# Patient Record
Sex: Male | Born: 1985 | Race: White | Hispanic: No | Marital: Married | Smoking: Never smoker
Health system: Southern US, Community
[De-identification: ages and names within clinical notes are randomized; demographics above are authoritative.]

## PROBLEM LIST (undated history)

## (undated) DIAGNOSIS — Z87442 Personal history of urinary calculi: Secondary | ICD-10-CM

## (undated) DIAGNOSIS — K219 Gastro-esophageal reflux disease without esophagitis: Secondary | ICD-10-CM

## (undated) DIAGNOSIS — F909 Attention-deficit hyperactivity disorder, unspecified type: Secondary | ICD-10-CM

## (undated) HISTORY — PX: VASECTOMY: SHX75

## (undated) HISTORY — PX: SHOULDER SURGERY: SHX246

---

## 2021-06-20 ENCOUNTER — Emergency Department (HOSPITAL_COMMUNITY)
Admission: EM | Admit: 2021-06-20 | Discharge: 2021-06-20 | Disposition: A | Discharge status: Home / Self Care | Payer: BC Managed Care – PPO | Attending: Emergency Medicine | Admitting: Emergency Medicine

## 2021-06-20 ENCOUNTER — Emergency Department (HOSPITAL_COMMUNITY): Payer: BC Managed Care – PPO

## 2021-06-20 ENCOUNTER — Other Ambulatory Visit: Payer: Self-pay

## 2021-06-20 ENCOUNTER — Encounter (HOSPITAL_COMMUNITY): Payer: Self-pay

## 2021-06-20 DIAGNOSIS — Z23 Encounter for immunization: Secondary | ICD-10-CM | POA: Insufficient documentation

## 2021-06-20 DIAGNOSIS — S60419A Abrasion of unspecified finger, initial encounter: Secondary | ICD-10-CM

## 2021-06-20 DIAGNOSIS — S6991XA Unspecified injury of right wrist, hand and finger(s), initial encounter: Secondary | ICD-10-CM | POA: Diagnosis present

## 2021-06-20 DIAGNOSIS — W230XXA Caught, crushed, jammed, or pinched between moving objects, initial encounter: Secondary | ICD-10-CM | POA: Insufficient documentation

## 2021-06-20 DIAGNOSIS — S61304A Unspecified open wound of right ring finger with damage to nail, initial encounter: Secondary | ICD-10-CM | POA: Diagnosis not present

## 2021-06-20 DIAGNOSIS — S61309A Unspecified open wound of unspecified finger with damage to nail, initial encounter: Secondary | ICD-10-CM

## 2021-06-20 MED ORDER — OXYCODONE-ACETAMINOPHEN 5-325 MG PO TABS
1.0000 | ORAL_TABLET | Freq: Once | ORAL | Status: AC
  Administered 2021-06-20: 1 via ORAL
  Filled 2021-06-20: qty 1

## 2021-06-20 MED ORDER — BUPIVACAINE HCL (PF) 0.5 % IJ SOLN
10.0000 mL | Freq: Once | INTRAMUSCULAR | Status: AC
  Administered 2021-06-20: 10 mL
  Filled 2021-06-20: qty 30

## 2021-06-20 MED ORDER — TETANUS-DIPHTH-ACELL PERTUSSIS 5-2.5-18.5 LF-MCG/0.5 IM SUSY
0.5000 mL | PREFILLED_SYRINGE | Freq: Once | INTRAMUSCULAR | Status: AC
  Administered 2021-06-20: 0.5 mL via INTRAMUSCULAR
  Filled 2021-06-20: qty 0.5

## 2021-06-20 NOTE — ED Provider Notes (Signed)
[facility]-EMERGENCY DEPT Provider Note   CSN: [id number] Arrival date & time: 06/20/21  1356     History Chief Complaint  Patient presents with   Finger Injury    Mike West is a 35 y.o. male.  HPI He injured his right fourth finger when he accidentally shot in the trunk of his car.  He could not release it so he "pulled it out."  He denies other injuries.  Unknown tetanus status.  There are no other known active modifying factors.    History reviewed. No pertinent past medical history.  There are no problems to display for this patient.   History reviewed. No pertinent surgical history.     History reviewed. No pertinent family history.  Social History   Tobacco Use   Smoking status: Never   Smokeless tobacco: Never  Vaping Use   Vaping Use: Never used  Substance Use Topics   Alcohol use: Never   Drug use: Never    Home Medications Prior to Admission medications   Not on File    Allergies    Patient has no known allergies.  Review of Systems   Review of Systems  All other systems reviewed and are negative.  Physical Exam Updated Vital Signs BP 125/87 (BP Location: Left Arm)   Pulse 89   Temp 98.6 F (37 C) (Oral)   Resp 18   Ht 5\' 6"  (1.676 m)   Wt 70.3 kg   SpO2 97%   BMI 25.02 kg/m   Physical Exam Vitals and nursing note reviewed.  Constitutional:      General: He is in acute distress (Uncomfortable due to pain).     Appearance: He is well-developed. He is not ill-appearing.  HENT:     Head: Normocephalic and atraumatic.     Right Ear: External ear normal.     Left Ear: External ear normal.  Eyes:     Conjunctiva/sclera: Conjunctivae normal.     Pupils: Pupils are equal, round, and reactive to light.  Neck:     Trachea: Phonation normal.  Cardiovascular:     Rate and Rhythm: Normal rate.  Pulmonary:     Effort: Pulmonary effort is normal.  Abdominal:     General: There is no distension.   Musculoskeletal:     Cervical back: Normal range of motion and neck supple.     Comments: Right finger fourth finger type injury.  He guards against flexing the right finger for secondary to pain.  There is nail and pad injury of the right finger 4.  Skin:    General: Skin is warm and dry.  Neurological:     Mental Status: He is alert and oriented to person, place, and time.     Cranial Nerves: No cranial nerve deficit.     Sensory: No sensory deficit.     Motor: No abnormal muscle tone.     Coordination: Coordination normal.  Psychiatric:        Mood and Affect: Mood normal.        Behavior: Behavior normal.        Thought Content: Thought content normal.        Judgment: Judgment normal.    ED Results / Procedures / Treatments   Labs (all labs ordered are listed, but only abnormal results are displayed) Labs Reviewed - No data to display  EKG None  Radiology DG Finger Ring Right  Result Date: 06/20/2021 CLINICAL DATA:  Fourth finger got caught  in a car trunk. Pain to the distal finger. EXAM: RIGHT RING FINGER 2+V COMPARISON:  None. FINDINGS: Apparent injury to the nail.  No fracture.  No bone lesion. Joints are normally spaced and aligned. Distal soft tissue swelling. IMPRESSION: No fracture or dislocation. Electronically Signed   By: Amie Portland M.D.   On: 06/20/2021 15:22    Procedures Procedures   Wound repair  Date/Time: 06/20/2021 4:14 PM Performed by: Mancel Bale, MD Authorized by: Mancel Bale, MD  Patient identity confirmed: verbally with patient Local anesthesia used: yes Anesthesia: digital block  Anesthesia: Local anesthesia used: yes Local Anesthetic: bupivacaine 0.5% without epinephrine Anesthetic total: 5 mL  Sedation: Patient sedated: no  Patient tolerance: patient tolerated the procedure well with no immediate complications Comments: Right fourth fingertip cleansed with Betadine and irrigated with saline.  Injuries revealed to be as  follows: Pad with abrasion, 1 cm, split skin thickness.  Partially adherent flap of skin removed with scissors.  Partial nailbed avulsion, longitudinally, ulnar aspect, with blood extruding from the tip indicating open nailbed injury.  No gross deformity of the nailbed.  No associated laceration of the cuticle or finger structure.  Nail is not elevated off the nail bed.  Bandaged placed on wound by nursing.   Medications Ordered in ED Medications  oxyCODONE-acetaminophen (PERCOCET/ROXICET) 5-325 MG per tablet 1 tablet (1 tablet Oral Given 06/20/21 1515)  Tdap (BOOSTRIX) injection 0.5 mL (0.5 mLs Intramuscular Given 06/20/21 1516)  bupivacaine (MARCAINE) 0.5 % injection 10 mL (10 mLs Infiltration Given by Other 06/20/21 1515)    ED Course  I have reviewed the triage vital signs and the nursing notes.  Pertinent labs & imaging results that were available during my care of the patient were reviewed by me and considered in my medical decision making (see chart for details).  Clinical Course as of 06/20/21 1625  Sat Jun 20, 2021  1536 DG Finger Ring Right [SD]  1536 DG Finger Ring Right [SD]    Clinical Course User Index [SD] Beryle Lathe   MDM Rules/Calculators/A&P                            Patient Vitals for the past 24 hrs:  BP Temp Temp src Pulse Resp SpO2 Height Weight  06/20/21 1447 -- -- -- -- -- -- 5\' 6"  (1.676 m) 70.3 kg  06/20/21 1446 125/87 98.6 F (37 C) Oral 89 18 97 % -- --    4:16 PM Reevaluation with update and discussion. After initial assessment and treatment, an updated evaluation reveals he is comfortable after room.  Heart has no further complaints. 06/22/21   Medical Decision Making:  This patient is presenting for evaluation of fingertip injury, which does require a range of treatment options, and is a complaint that involves a moderate risk of morbidity and mortality. The differential diagnoses include fracture, laceration, abrasion,  contusion. I decided to review old records, and in summary isolated injury fingertip when he excellently shot it a car trunk lid.  I did not require additional historical information from anyone.  Radiologic Tests Ordered, included right finger for.  I independently Visualized: Radiographic images, which show no fracture   Critical Interventions-clinical evaluation, wound repair, tetanus booster  After These Interventions, the Patient was reevaluated and was found stable for discharge.  Wound did not require suturing, manipulation of the nail or nailbed.  Plan symptomatic wound care at home.  Recommended twice  daily soaks until healed    CRITICAL CARE-no Performed by: Mancel Bale  Nursing Notes Reviewed/ Care Coordinated Applicable Imaging Reviewed Interpretation of Laboratory Data incorporated into ED treatment  The patient appears reasonably screened and/or stabilized for discharge and I doubt any other medical condition or other [facility] requiring further screening, evaluation, or treatment in the ED at this time prior to discharge.  Plan: Home Medications-OTC as needed; Home Treatments-wound care; return here if the recommended treatment, does not improve the symptoms; Recommended follow up-PCP, PRN     Final Clinical Impression(s) / ED Diagnoses Final diagnoses:  Avulsion of fingernail, initial encounter  Abrasion of finger, initial encounter    Rx / DC Orders ED Discharge Orders     None        Mancel Bale, MD 06/20/21 1628

## 2021-06-20 NOTE — ED Triage Notes (Signed)
Closed right fourth digit finger in car trunk. Finger became stuck and pt had to pull finger out of trunk. No bleeding at this time.

## 2021-06-20 NOTE — Discharge Instructions (Signed)
There is no fracture or laceration of the finger.  Your nail is lifted up and may or may not fall off completely.  The abrasion on your finger will heal in a week or so.  To help the wounds heal and to be more comfortable, soak the right finger in warm water for 20 minutes, twice a day and clean it well with soap and water.  After that keep a bandage on it until the drainage and bleeding stops.  If the nail falls off it would take 1-2 months for the new one to grow in.  Take Tylenol or Motrin for pain.  Return here or see your doctor for problems.

## 2021-06-20 NOTE — ED Notes (Signed)
Tefla non adherent dressing applied to Patient finger and taped.

## 2021-08-18 ENCOUNTER — Emergency Department (HOSPITAL_COMMUNITY): Payer: BC Managed Care – PPO

## 2021-08-18 ENCOUNTER — Encounter (HOSPITAL_COMMUNITY): Payer: Self-pay

## 2021-08-18 ENCOUNTER — Emergency Department (HOSPITAL_COMMUNITY)
Admission: EM | Admit: 2021-08-18 | Discharge: 2021-08-19 | Disposition: A | Discharge status: Home / Self Care | Payer: BC Managed Care – PPO | Attending: Student | Admitting: Student

## 2021-08-18 ENCOUNTER — Other Ambulatory Visit: Payer: Self-pay

## 2021-08-18 DIAGNOSIS — T148XXA Other injury of unspecified body region, initial encounter: Secondary | ICD-10-CM

## 2021-08-18 DIAGNOSIS — S52612A Displaced fracture of left ulna styloid process, initial encounter for closed fracture: Secondary | ICD-10-CM | POA: Diagnosis not present

## 2021-08-18 DIAGNOSIS — S6992XA Unspecified injury of left wrist, hand and finger(s), initial encounter: Secondary | ICD-10-CM | POA: Diagnosis present

## 2021-08-18 DIAGNOSIS — Y9351 Activity, roller skating (inline) and skateboarding: Secondary | ICD-10-CM | POA: Insufficient documentation

## 2021-08-18 DIAGNOSIS — S52572A Other intraarticular fracture of lower end of left radius, initial encounter for closed fracture: Secondary | ICD-10-CM | POA: Diagnosis not present

## 2021-08-18 DIAGNOSIS — S52502A Unspecified fracture of the lower end of left radius, initial encounter for closed fracture: Secondary | ICD-10-CM

## 2021-08-18 DIAGNOSIS — Y9289 Other specified places as the place of occurrence of the external cause: Secondary | ICD-10-CM | POA: Diagnosis not present

## 2021-08-18 MED ORDER — HYDROCODONE-ACETAMINOPHEN 5-325 MG PO TABS
1.0000 | ORAL_TABLET | Freq: Once | ORAL | Status: AC
  Administered 2021-08-18: 18:00:00 1 via ORAL
  Filled 2021-08-18: qty 1

## 2021-08-18 MED ORDER — ONDANSETRON HCL 4 MG/2ML IJ SOLN
4.0000 mg | Freq: Once | INTRAMUSCULAR | Status: AC
  Administered 2021-08-18: 22:00:00 4 mg via INTRAVENOUS
  Filled 2021-08-18: qty 2

## 2021-08-18 MED ORDER — LIDOCAINE HCL (PF) 1 % IJ SOLN
10.0000 mL | Freq: Once | INTRAMUSCULAR | Status: AC
  Administered 2021-08-18: 21:00:00 10 mL
  Filled 2021-08-18: qty 30

## 2021-08-18 MED ORDER — FENTANYL CITRATE PF 50 MCG/ML IJ SOSY
100.0000 ug | PREFILLED_SYRINGE | Freq: Once | INTRAMUSCULAR | Status: AC
  Administered 2021-08-18: 23:00:00 100 ug via INTRAVENOUS
  Filled 2021-08-18: qty 2

## 2021-08-18 MED ORDER — MORPHINE SULFATE (PF) 4 MG/ML IV SOLN
4.0000 mg | Freq: Once | INTRAVENOUS | Status: AC
  Administered 2021-08-18: 22:00:00 4 mg via INTRAVENOUS
  Filled 2021-08-18: qty 1

## 2021-08-18 MED ORDER — FENTANYL CITRATE PF 50 MCG/ML IJ SOSY
100.0000 ug | PREFILLED_SYRINGE | Freq: Once | INTRAMUSCULAR | Status: AC
  Administered 2021-08-18: 21:00:00 100 ug via INTRAVENOUS
  Filled 2021-08-18: qty 2

## 2021-08-18 MED ORDER — KETOROLAC TROMETHAMINE 15 MG/ML IJ SOLN
15.0000 mg | Freq: Once | INTRAMUSCULAR | Status: AC
Start: 2021-08-18 — End: 2021-08-18
  Administered 2021-08-18: 23:00:00 15 mg via INTRAVENOUS
  Filled 2021-08-18: qty 1

## 2021-08-18 MED ORDER — ONDANSETRON HCL 4 MG/2ML IJ SOLN
4.0000 mg | Freq: Once | INTRAMUSCULAR | Status: AC
  Administered 2021-08-18: 23:00:00 4 mg via INTRAVENOUS
  Filled 2021-08-18: qty 2

## 2021-08-18 NOTE — ED Triage Notes (Signed)
Patient wrecked while skateboarding. Patient c/o left wrist pain. Patient denies hitting his head.

## 2021-08-18 NOTE — ED Provider Notes (Signed)
°  Provider Note MRN:  [id number]  Arrival date & time: 08/18/21    ED Course and Medical Decision Making  Assumed care from Dr. Tinnie Gens at shift change.  Distal radius fracture awaiting splint, will need CT prior to discharge.  Procedures  Final Clinical Impressions(s) / ED Diagnoses     ICD-10-CM   1. Fracture  T14.8XXA DG Wrist 2 Views Left    DG Wrist 2 Views Left      ED Discharge Orders     None       Discharge Instructions   None     Barth Kirks. Sedonia Small, Rayle [email or web address]    Maudie Flakes, MD 08/19/21 [phone]

## 2021-08-18 NOTE — ED Provider Triage Note (Signed)
Emergency Medicine Provider Triage Evaluation Note  Mike West , a 36 y.o. male  was evaluated in triage.  Pt complains of left wrist pain.  Was skateboarding and he fell with his arm outstretched in front of him.  No injury elsewhere.  Review of Systems  Positive: Left wrist pain Negative: Numbness  Physical Exam  BP (!) 153/97 (BP Location: Right Arm)    Pulse 96    Temp 98.9 F (37.2 C) (Oral)    Resp 18    SpO2 100%  Gen:   Awake, no distress   Resp:  Normal effort  MSK:   Left wrist diffusely swollen and tender.  Unable to range due to pain.  Tenderness palpation of the fourth and fifth metacarpals proximally.  Good distal sensation and cap refill.  Able to wiggle fingers and move thumb without difficulty.  No pain of the elbow or shoulder.  No tenderness to palpation of midline C-spine   Medical Decision Making  Medically screening exam initiated at 5:45 PM.  Appropriate orders placed.  Rubens Cranston was informed that the remainder of the evaluation will be completed by another provider, this initial triage assessment does not replace that evaluation, and the importance of remaining in the ED until their evaluation is complete.  Xray, pain meds   Alveria Apley, PA-C 08/18/21 1746

## 2021-08-18 NOTE — ED Provider Notes (Signed)
[facility] DEPT Provider Note  CSN: [id number] Arrival date & time: 08/18/21 1656  Chief Complaint(s) Wrist Injury  HPI Mike West is a 36 y.o. male who presents emergency department for evaluation of a left wrist injury.  Patient was skateboarding with his daughter when he fell off his skateboard and landed onto outstretched wrists.  Patient suffered an immediate injury and presents to the emergency department with noticeable swelling and deformity to the left wrist.  No head strike or loss of consciousness.  No additional evidence of trauma.  Range of motion limited by pain but pulse intact, full range of motion of the fingers.   Wrist Injury  Past Medical History History reviewed. No pertinent past medical history. There are no problems to display for this patient.  Home Medication(s) Prior to Admission medications   Not on File                                                                                                                                    Past Surgical History Past Surgical History:  Procedure Laterality Date   SHOULDER SURGERY Left    VASECTOMY     Family History Family History  Problem Relation Age of Onset   Healthy Mother    Healthy Father     Social History Social History   Tobacco Use   Smoking status: Never   Smokeless tobacco: Never  Vaping Use   Vaping Use: Never used  Substance Use Topics   Alcohol use: Never   Drug use: Never   Allergies Patient has no known allergies.  Review of Systems Review of Systems  Musculoskeletal:  Positive for arthralgias.   Physical Exam Vital Signs  I have reviewed the triage vital signs BP (!) 153/97 (BP Location: Right Arm)    Pulse 96    Temp 98.9 F (37.2 C) (Oral)    Resp 18    Ht 5\' 6"  (1.676 m)    Wt 69.4 kg    SpO2 100%    BMI 24.69 kg/m   Physical Exam Vitals and nursing note reviewed.  Constitutional:      General: He is not in acute distress.     Appearance: He is well-developed.  HENT:     Head: Normocephalic and atraumatic.  Eyes:     Conjunctiva/sclera: Conjunctivae normal.  Cardiovascular:     Rate and Rhythm: Normal rate and regular rhythm.     Heart sounds: No murmur heard. Pulmonary:     Effort: Pulmonary effort is normal. No respiratory distress.     Breath sounds: Normal breath sounds.  Abdominal:     Palpations: Abdomen is soft.     Tenderness: There is no abdominal tenderness.  Musculoskeletal:        General: Swelling, tenderness (Left wrist) and deformity present.     Cervical back: Neck supple.  Skin:    General: Skin is warm  and dry.     Capillary Refill: Capillary refill takes less than 2 seconds.  Neurological:     Mental Status: He is alert.  Psychiatric:        Mood and Affect: Mood normal.    ED Results and Treatments Labs (all labs ordered are listed, but only abnormal results are displayed) Labs Reviewed - No data to display                                                                                                                        Radiology DG Wrist 2 Views Left  Result Date: 08/18/2021 CLINICAL DATA:  Reduction EXAM: LEFT WRIST - 2 VIEW COMPARISON:  08/18/2021 FINDINGS: Highly comminuted intra-articular distal radius fracture with decreased volar displacement. Acute ulnar styloid process fracture similar in alignment. IMPRESSION: Acute comminuted intra-articular distal radius fracture with decreased displacement. Similar alignment of ulnar styloid process fracture Electronically Signed   By: Donavan Foil M.D.   On: 08/18/2021 22:23   DG Wrist Complete Left  Result Date: 08/18/2021 CLINICAL DATA:  A 36 year old male presents following skateboard accident. EXAM: LEFT WRIST - COMPLETE 3+ VIEW COMPARISON:  Hand evaluation of the same date. FINDINGS: Intra-articular fracture with comminution, angulation and displacement of the distal radial articular surface and ventral and radial aspect of  the distal radius. Displaced fracture fragment approximately 4-5 mm of distraction. Volar tilt of the radial articular surface is moderate. Associated ulnar styloid fracture and extensive soft tissue swelling about the ventral distal forearm and wrist. No additional fractures are identified. IMPRESSION: 1. Comminuted, displaced, intra-articular fracture of the distal radius with moderate volar tilt of the radial articular surface. 2. Associated ulnar styloid fracture and extensive soft tissue swelling. Electronically Signed   By: Zetta Bills M.D.   On: 08/18/2021 18:37   DG Hand Complete Left  Result Date: 08/18/2021 CLINICAL DATA:  Fall with hand pain after skateboarding accident. EXAM: LEFT HAND - COMPLETE 3+ VIEW COMPARISON:  Wrist evaluation of the same date. FINDINGS: Impacted comminuted, angulated and displaced fracture of the distal radius with associated ulnar fracture better displayed on wrist evaluation of the same date. Carpal bones are intact. There is volar displacement of the distal radial fracture fragment that extends into the articular surface and volar tilt of the distal radial articular surface. Associated extensive soft tissue swelling mainly about the volar aspect of the distal forearm. IMPRESSION: Impacted comminuted distal radial fracture with intra-articular extension and volar tilt of the distal articular surface. Associated ulnar styloid fracture, see wrist radiographs. Electronically Signed   By: Zetta Bills M.D.   On: 08/18/2021 18:33    Pertinent labs & imaging results that were available during my care of the patient were reviewed by me and considered in my medical decision making (see MDM for details).  Medications Ordered in ED Medications  HYDROcodone-acetaminophen (NORCO/VICODIN) 5-325 MG per tablet 1 tablet (1 tablet Oral Given 08/18/21 1759)  lidocaine (PF) (XYLOCAINE) 1 % injection 10 mL (  10 mLs Infiltration Given 08/18/21 2100)  fentaNYL (SUBLIMAZE) injection 100  mcg (100 mcg Intravenous Given 08/18/21 2100)  morphine 4 MG/ML injection 4 mg (4 mg Intravenous Given 08/18/21 2148)  ondansetron (ZOFRAN) injection 4 mg (4 mg Intravenous Given 08/18/21 2148)  ondansetron (ZOFRAN) injection 4 mg (4 mg Intravenous Given 08/18/21 2245)  fentaNYL (SUBLIMAZE) injection 100 mcg (100 mcg Intravenous Given 08/18/21 2245)  ketorolac (TORADOL) 15 MG/ML injection 15 mg (15 mg Intravenous Given 08/18/21 2248)                                                                                                                                     Procedures .Ortho Injury Treatment  Date/Time: 08/18/2021 11:41 PM Performed by: Teressa Lower, MD Authorized by: Teressa Lower, MD   Consent:    Consent obtained:  Verbal   Consent given by:  Patient   Risks discussed:  Fracture   Alternatives discussed:  Immobilization and alternative treatmentInjury location: forearm Location details: right forearm Injury type: fracture Fracture type: distal radius and ulnar styloid Pre-procedure neurovascular assessment: neurovascularly intact Pre-procedure distal perfusion: normal Pre-procedure neurological function: normal Pre-procedure range of motion: reduced Anesthesia: hematoma block  Anesthesia: Local anesthesia used: yes Local Anesthetic: lidocaine 1% without epinephrine Anesthetic total: 10 mL  Patient sedated: NoManipulation performed: yes Skin traction used: yes X-ray confirmed reduction: yes Immobilization: splint Splint Applied by: Sheliah Hatch Post-procedure distal perfusion: normal Post-procedure neurological function: normal Post-procedure range of motion: unchanged    (including critical care time)  Medical Decision Making / ED Course   This patient presents to the ED for concern of left wrist fracture, this involves an extensive number of treatment options, and is a complaint that carries with it a high risk of complications and morbidity.  The differential  diagnosis includes fracture, dislocation, sprain  MDM: Patient to the emergency department for evaluation of a left wrist fracture.  Physical exam reveals an obvious deformity to the left wrist with associated swelling.  Pulses intact.  X-ray reveals a intra-articular distal radius fracture and an ulnar styloid fracture.  Pain control difficult here in the emergency department he required small doses of opioid pain medication.  Patient placed in finger traps and repeat x-rays performed showing improvements in the level of angulation of the radius fracture.  Hand surgery was consulted who recommends sugar-tong splinting and CT prior to discharge.  Patient then signed out to oncoming provider with CT and splinting pending.  Please see provider signout note for continuation of work-up.   Additional history obtained: -Additional history obtained from daughter, wife -External records from outside source obtained and reviewed including: Chart review including previous notes, labs, imaging, consultation notes   Lab Tests: -I ordered, reviewed, and interpreted labs.   The pertinent results include:   Labs Reviewed - No data to display    Imaging Studies ordered: I ordered imaging studies including XR wrist I independently  visualized and interpreted imaging. I agree with the radiologist interpretation   Medicines ordered and prescription drug management: Meds ordered this encounter  Medications   HYDROcodone-acetaminophen (NORCO/VICODIN) 5-325 MG per tablet 1 tablet   lidocaine (PF) (XYLOCAINE) 1 % injection 10 mL   fentaNYL (SUBLIMAZE) injection 100 mcg   morphine 4 MG/ML injection 4 mg   ondansetron (ZOFRAN) injection 4 mg   ondansetron (ZOFRAN) injection 4 mg   fentaNYL (SUBLIMAZE) injection 100 mcg   ketorolac (TORADOL) 15 MG/ML injection 15 mg    -I have reviewed the patients home medicines and have made adjustments as needed  Critical interventions none  Consultations  Obtained: I requested consultation with the orthopedic surgeon,  and discussed lab and imaging findings as well as pertinent plan - they recommend: Outpatient follow-up in 2 days   Cardiac Monitoring: The patient was maintained on a cardiac monitor.  I personally viewed and interpreted the cardiac monitored which showed an underlying rhythm of: NSR  Social Determinants of Health:  Factors impacting patients care include: none   Reevaluation: After the interventions noted above, I reevaluated the patient and found that they have :improved  Co morbidities that complicate the patient evaluation History reviewed. No pertinent past medical history.    Dispostion: I considered admission for this patient, the patient is safe for outpatient follow-up.  Patient will follow-up outpatient with hand.     Final Clinical Impression(s) / ED Diagnoses Final diagnoses:  Fracture     @PCDICTATION @    Teressa Lower, MD 08/18/21 [phone]

## 2021-08-19 MED ORDER — HYDROCODONE-ACETAMINOPHEN 5-325 MG PO TABS: 2.0000 | ORAL_TABLET | ORAL | 0 refills | Status: DC | PRN

## 2021-08-19 MED ORDER — NAPROXEN 375 MG PO TABS: 375.0000 mg | ORAL_TABLET | Freq: Two times a day (BID) | ORAL | 0 refills | Status: AC

## 2021-08-19 NOTE — Progress Notes (Signed)
Orthopedic Tech Progress Note Patient Details:  Mike West [birth year] [id number]  Ortho Devices Type of Ortho Device: Sugartong splint, Finger trap Finger Trap Weight: 5lbs Ortho Device/Splint Location: lue Ortho Device/Splint Interventions: Ordered, Application, Adjustment   Post Interventions Patient Tolerated: Well  Al Decant 08/19/2021, 2:01 AM

## 2021-08-20 ENCOUNTER — Encounter: Payer: Self-pay | Admitting: Orthopedic Surgery

## 2021-08-20 ENCOUNTER — Other Ambulatory Visit: Payer: Self-pay | Admitting: Orthopedic Surgery

## 2021-08-20 ENCOUNTER — Other Ambulatory Visit: Payer: Self-pay

## 2021-08-20 ENCOUNTER — Telehealth: Payer: Self-pay | Admitting: Orthopedic Surgery

## 2021-08-20 ENCOUNTER — Ambulatory Visit (INDEPENDENT_AMBULATORY_CARE_PROVIDER_SITE_OTHER): Payer: BC Managed Care – PPO | Admitting: Orthopedic Surgery

## 2021-08-20 ENCOUNTER — Other Ambulatory Visit: Payer: Self-pay | Admitting: Orthopaedic Surgery

## 2021-08-20 DIAGNOSIS — S52572A Other intraarticular fracture of lower end of left radius, initial encounter for closed fracture: Secondary | ICD-10-CM | POA: Diagnosis not present

## 2021-08-20 DIAGNOSIS — S52502A Unspecified fracture of the lower end of left radius, initial encounter for closed fracture: Secondary | ICD-10-CM | POA: Insufficient documentation

## 2021-08-20 MED ORDER — OXYCODONE HCL 5 MG PO TABS: 5.0000 mg | ORAL_TABLET | Freq: Four times a day (QID) | ORAL | 0 refills | Status: DC | PRN

## 2021-08-20 MED ORDER — HYDROCODONE-ACETAMINOPHEN 5-325 MG PO TABS: 1.0000 | ORAL_TABLET | Freq: Four times a day (QID) | ORAL | 0 refills | Status: DC | PRN

## 2021-08-20 NOTE — Telephone Encounter (Signed)
Patient called. He would like some pain medication called in for him. His call back number is [phone]

## 2021-08-20 NOTE — H&P (View-Only) (Signed)
Office Visit Note   Patient: Mike West           Date of Birth: August 19, 1985           MRN: [id number] Visit Date: 08/20/2021              Requested by: No referring provider defined for this encounter. PCP: Pcp, No   Assessment & Plan: Visit Diagnoses:  1. Other closed intra-articular fracture of distal end of left radius, initial encounter     Plan: We discussed the diagnosis, prognosis, and need for surgical fracture fixation.  We reviewed the x-rays which demonstrate a severely comminuted intra-articular distal radius fracture with volar shear component.  A CT was obtained in the ER for surgical planning.  We discussed the possible need for both dorsal and bridge plating.  We discussed the possible need for TFCC repair pending DRUJ instability.   We reviewed the benefits of surgery and the potential risks including, but not limited to infection, damage to nearby nerves and blood vessels, delayed wound healing, complex regional pain syndrome, malunion, nonunion, stiffness, decreased range of motion.    All patient concerns and questions were addressed.  A surgical date will be confirmed with the patient.    Follow-Up Instructions: No follow-ups on file.   Orders:  No orders of the defined types were placed in this encounter.  No orders of the defined types were placed in this encounter.     Procedures: No procedures performed   Clinical Data: No additional findings.   Subjective: Chief Complaint  Patient presents with   Left Wrist - Pain    This is a 36 year old right-hand-dominant male who presents for ER follow-up of a left distal radius fracture.  He is skateboarding on Tuesday when the wheel hit a rock causing him to fly for out the skateboard.  He was seen in the ER where he was found to have a displaced and comminuted left distal radius fracture.  A closed reduction was performed and he was placed in a sugar-tong splint.  He says he is continue to have  severe pain in the wrist.  He has been trying to keep the wrist elevated while he sleeps but is having difficulty sleeping.  He is taking hydrocodone and naproxen.  He denies any numbness or paresthesias in the fingers.   Review of Systems   Objective: Vital Signs: There were no vitals taken for this visit.  Physical Exam Constitutional:      Appearance: Normal appearance.  Cardiovascular:     Rate and Rhythm: Normal rate.     Pulses: Normal pulses.  Pulmonary:     Effort: Pulmonary effort is normal.  Skin:    General: Skin is warm and dry.     Capillary Refill: Capillary refill takes less than 2 seconds.  Neurological:     Mental Status: He is alert.    Left Hand Exam   Other  Sensation: normal Pulse: present  Comments:  SILT all fingers and symmetric to uninjured side..  Limited finger ROM secondary to swelling.  Ring removed from swollen ring finger.     Specialty Comments:  No specialty comments available.  Imaging: No results found.   PMFS History: Patient Active Problem List   Diagnosis Date Noted   Closed fracture of left distal radius 08/20/2021   History reviewed. No pertinent past medical history.  Family History  Problem Relation Age of Onset   Healthy Mother    Healthy Father  Past Surgical History:  °Procedure Laterality Date  ° SHOULDER SURGERY Left   ° VASECTOMY    ° °Social History  ° °Occupational History  ° Not on file  °Tobacco Use  ° Smoking status: Never  ° Smokeless tobacco: Never  °Vaping Use  ° Vaping Use: Never used  °Substance and Sexual Activity  ° Alcohol use: Never  ° Drug use: Never  ° Sexual activity: Not on file  ° ° ° ° ° ° °

## 2021-08-20 NOTE — Progress Notes (Signed)
Office Visit Note   Patient: Mike West           Date of Birth: August 19, 1985           MRN: [id number] Visit Date: 08/20/2021              Requested by: No referring provider defined for this encounter. PCP: Pcp, No   Assessment & Plan: Visit Diagnoses:  1. Other closed intra-articular fracture of distal end of left radius, initial encounter     Plan: We discussed the diagnosis, prognosis, and need for surgical fracture fixation.  We reviewed the x-rays which demonstrate a severely comminuted intra-articular distal radius fracture with volar shear component.  A CT was obtained in the ER for surgical planning.  We discussed the possible need for both dorsal and bridge plating.  We discussed the possible need for TFCC repair pending DRUJ instability.   We reviewed the benefits of surgery and the potential risks including, but not limited to infection, damage to nearby nerves and blood vessels, delayed wound healing, complex regional pain syndrome, malunion, nonunion, stiffness, decreased range of motion.    All patient concerns and questions were addressed.  A surgical date will be confirmed with the patient.    Follow-Up Instructions: No follow-ups on file.   Orders:  No orders of the defined types were placed in this encounter.  No orders of the defined types were placed in this encounter.     Procedures: No procedures performed   Clinical Data: No additional findings.   Subjective: Chief Complaint  Patient presents with   Left Wrist - Pain    This is a 36 year old right-hand-dominant male who presents for ER follow-up of a left distal radius fracture.  He is skateboarding on Tuesday when the wheel hit a rock causing him to fly for out the skateboard.  He was seen in the ER where he was found to have a displaced and comminuted left distal radius fracture.  A closed reduction was performed and he was placed in a sugar-tong splint.  He says he is continue to have  severe pain in the wrist.  He has been trying to keep the wrist elevated while he sleeps but is having difficulty sleeping.  He is taking hydrocodone and naproxen.  He denies any numbness or paresthesias in the fingers.   Review of Systems   Objective: Vital Signs: There were no vitals taken for this visit.  Physical Exam Constitutional:      Appearance: Normal appearance.  Cardiovascular:     Rate and Rhythm: Normal rate.     Pulses: Normal pulses.  Pulmonary:     Effort: Pulmonary effort is normal.  Skin:    General: Skin is warm and dry.     Capillary Refill: Capillary refill takes less than 2 seconds.  Neurological:     Mental Status: He is alert.    Left Hand Exam   Other  Sensation: normal Pulse: present  Comments:  SILT all fingers and symmetric to uninjured side..  Limited finger ROM secondary to swelling.  Ring removed from swollen ring finger.     Specialty Comments:  No specialty comments available.  Imaging: No results found.   PMFS History: Patient Active Problem List   Diagnosis Date Noted   Closed fracture of left distal radius 08/20/2021   History reviewed. No pertinent past medical history.  Family History  Problem Relation Age of Onset   Healthy Mother    Healthy Father  Past Surgical History:  Procedure Laterality Date   SHOULDER SURGERY Left    VASECTOMY     Social History   Occupational History   Not on file  Tobacco Use   Smoking status: Never   Smokeless tobacco: Never  Vaping Use   Vaping Use: Never used  Substance and Sexual Activity   Alcohol use: Never   Drug use: Never   Sexual activity: Not on file

## 2021-08-21 ENCOUNTER — Encounter (HOSPITAL_COMMUNITY): Payer: Self-pay | Admitting: Orthopedic Surgery

## 2021-08-21 ENCOUNTER — Other Ambulatory Visit: Payer: Self-pay

## 2021-08-21 NOTE — Progress Notes (Signed)
Mr. Mike West denies chest pain or shortness of breath. Patient denies having any s/s of Covid in his household.  Patient denies any known exposure to Covid.   PCP is with [facility]. Blood pressure was elevated in the ED,  Mr. Mike West was in lots of pain.  I instructed Mr. Mike West to stop Naproxen.  I instructed Mr. Mike West to shower with antibiotic soap, if it is available.  Dry off with a clean towel. Do not put lotion, powder, cologne or deodorant or makeup.No jewelry or piercings. Men may shave their face and neck. Woman should not shave. No nail polish, artificial or acrylic nails. Wear clean clothes, brush your teeth. Glasses, contact lens,dentures or partials may not be worn in the OR. If you need to wear them, please bring a case for glasses, do not wear contacts or bring a case, the hospital does not have contact cases, dentures or partials will have to be removed , make sure they are clean, we will provide a denture cup to put them in. You will need some one to drive you home and a responsible person over the age of 69 to stay with you for the first 24 hours after surgery.

## 2021-08-24 ENCOUNTER — Other Ambulatory Visit: Payer: Self-pay

## 2021-08-24 ENCOUNTER — Ambulatory Visit (HOSPITAL_COMMUNITY): Payer: BC Managed Care – PPO

## 2021-08-24 ENCOUNTER — Ambulatory Visit (HOSPITAL_COMMUNITY): Payer: BC Managed Care – PPO | Admitting: Anesthesiology

## 2021-08-24 ENCOUNTER — Encounter (HOSPITAL_COMMUNITY)
Admission: RE | Disposition: A | Discharge status: Home / Self Care | Payer: Self-pay | Source: Home / Self Care | Attending: Orthopedic Surgery

## 2021-08-24 ENCOUNTER — Encounter (HOSPITAL_COMMUNITY): Payer: Self-pay | Admitting: Orthopedic Surgery

## 2021-08-24 ENCOUNTER — Ambulatory Visit (HOSPITAL_COMMUNITY)
Admission: RE | Admit: 2021-08-24 | Discharge: 2021-08-24 | Disposition: A | Discharge status: Home / Self Care | Payer: BC Managed Care – PPO | Attending: Orthopedic Surgery | Admitting: Orthopedic Surgery

## 2021-08-24 DIAGNOSIS — W2209XA Striking against other stationary object, initial encounter: Secondary | ICD-10-CM | POA: Insufficient documentation

## 2021-08-24 DIAGNOSIS — S52572A Other intraarticular fracture of lower end of left radius, initial encounter for closed fracture: Secondary | ICD-10-CM | POA: Insufficient documentation

## 2021-08-24 DIAGNOSIS — Y9351 Activity, roller skating (inline) and skateboarding: Secondary | ICD-10-CM | POA: Insufficient documentation

## 2021-08-24 HISTORY — DX: Personal history of urinary calculi: Z87.442

## 2021-08-24 HISTORY — DX: Gastro-esophageal reflux disease without esophagitis: K21.9

## 2021-08-24 HISTORY — PX: OPEN REDUCTION INTERNAL FIXATION (ORIF) DISTAL RADIAL FRACTURE: SHX5989

## 2021-08-24 HISTORY — DX: Attention-deficit hyperactivity disorder, unspecified type: F90.9

## 2021-08-24 LAB — CBC
HCT: 44.9 % (ref 39.0–52.0)
Hemoglobin: 14.8 g/dL (ref 13.0–17.0)
MCH: 27.7 pg (ref 26.0–34.0)
MCHC: 33 g/dL (ref 30.0–36.0)
MCV: 83.9 fL (ref 80.0–100.0)
Platelets: 321 10*3/uL (ref 150–400)
RBC: 5.35 MIL/uL (ref 4.22–5.81)
RDW: 13.7 % (ref 11.5–15.5)
WBC: 11 10*3/uL — ABNORMAL HIGH (ref 4.0–10.5)
nRBC: 0 % (ref 0.0–0.2)

## 2021-08-24 SURGERY — OPEN REDUCTION INTERNAL FIXATION (ORIF) DISTAL RADIUS FRACTURE
Anesthesia: Regional | Site: Wrist | Laterality: Left

## 2021-08-24 MED ORDER — ACETAMINOPHEN 160 MG/5ML PO SOLN: 325.0000 mg | ORAL | Status: DC | PRN

## 2021-08-24 MED ORDER — OXYCODONE HCL 5 MG PO TABS: 5.0000 mg | ORAL_TABLET | Freq: Once | ORAL | Status: DC | PRN

## 2021-08-24 MED ORDER — FENTANYL CITRATE (PF) 100 MCG/2ML IJ SOLN
INTRAMUSCULAR | Status: AC
  Filled 2021-08-24: qty 2

## 2021-08-24 MED ORDER — PROPOFOL 10 MG/ML IV BOLUS
INTRAVENOUS | Status: DC | PRN
  Administered 2021-08-24: 200 mg via INTRAVENOUS

## 2021-08-24 MED ORDER — ORAL CARE MOUTH RINSE: 15.0000 mL | Freq: Once | OROMUCOSAL | Status: AC

## 2021-08-24 MED ORDER — MEPERIDINE HCL 25 MG/ML IJ SOLN: 6.2500 mg | INTRAMUSCULAR | Status: DC | PRN

## 2021-08-24 MED ORDER — FENTANYL CITRATE (PF) 100 MCG/2ML IJ SOLN
25.0000 ug | INTRAMUSCULAR | Status: DC | PRN
  Administered 2021-08-24: 50 ug via INTRAVENOUS

## 2021-08-24 MED ORDER — 0.9 % SODIUM CHLORIDE (POUR BTL) OPTIME
TOPICAL | Status: DC | PRN
Start: 2021-08-24 — End: 2021-08-24
  Administered 2021-08-24: 1000 mL

## 2021-08-24 MED ORDER — CEFAZOLIN SODIUM-DEXTROSE 2-4 GM/100ML-% IV SOLN
2.0000 g | INTRAVENOUS | Status: AC
  Administered 2021-08-24: 2 g via INTRAVENOUS
  Filled 2021-08-24: qty 100

## 2021-08-24 MED ORDER — OXYCODONE HCL 5 MG PO TABS: 5.0000 mg | ORAL_TABLET | ORAL | 0 refills | Status: AC | PRN

## 2021-08-24 MED ORDER — BUPIVACAINE-EPINEPHRINE (PF) 0.5% -1:200000 IJ SOLN
INTRAMUSCULAR | Status: DC | PRN
  Administered 2021-08-24: 15 mL via PERINEURAL

## 2021-08-24 MED ORDER — MIDAZOLAM HCL 2 MG/2ML IJ SOLN: 2.0000 mg | Freq: Once | INTRAMUSCULAR | Status: AC

## 2021-08-24 MED ORDER — FENTANYL CITRATE (PF) 100 MCG/2ML IJ SOLN
INTRAMUSCULAR | Status: AC
  Administered 2021-08-24: 100 ug via INTRAVENOUS
  Filled 2021-08-24: qty 2

## 2021-08-24 MED ORDER — ONDANSETRON HCL 4 MG/2ML IJ SOLN: 4.0000 mg | Freq: Once | INTRAMUSCULAR | Status: DC | PRN

## 2021-08-24 MED ORDER — ACETAMINOPHEN 325 MG PO TABS: 325.0000 mg | ORAL_TABLET | ORAL | Status: DC | PRN

## 2021-08-24 MED ORDER — FENTANYL CITRATE (PF) 100 MCG/2ML IJ SOLN: 100.0000 ug | Freq: Once | INTRAMUSCULAR | Status: AC

## 2021-08-24 MED ORDER — BUPIVACAINE LIPOSOME 1.3 % IJ SUSP
INTRAMUSCULAR | Status: DC | PRN
Start: 2021-08-24 — End: 2021-08-24
  Administered 2021-08-24: 10 mL via PERINEURAL

## 2021-08-24 MED ORDER — CHLORHEXIDINE GLUCONATE 0.12 % MT SOLN
OROMUCOSAL | Status: AC
  Filled 2021-08-24: qty 15

## 2021-08-24 MED ORDER — ONDANSETRON HCL 4 MG/2ML IJ SOLN
INTRAMUSCULAR | Status: DC | PRN
  Administered 2021-08-24: 4 mg via INTRAVENOUS

## 2021-08-24 MED ORDER — FENTANYL CITRATE (PF) 250 MCG/5ML IJ SOLN
INTRAMUSCULAR | Status: DC | PRN
Start: 2021-08-24 — End: 2021-08-24
  Administered 2021-08-24: 50 ug via INTRAVENOUS

## 2021-08-24 MED ORDER — FENTANYL CITRATE (PF) 250 MCG/5ML IJ SOLN
INTRAMUSCULAR | Status: AC
  Filled 2021-08-24: qty 5

## 2021-08-24 MED ORDER — PROPOFOL 10 MG/ML IV BOLUS
INTRAVENOUS | Status: AC
  Filled 2021-08-24: qty 20

## 2021-08-24 MED ORDER — OXYCODONE HCL 5 MG/5ML PO SOLN: 5.0000 mg | Freq: Once | ORAL | Status: DC | PRN

## 2021-08-24 MED ORDER — CHLORHEXIDINE GLUCONATE 0.12 % MT SOLN
15.0000 mL | Freq: Once | OROMUCOSAL | Status: AC
  Administered 2021-08-24: 15 mL via OROMUCOSAL

## 2021-08-24 MED ORDER — LACTATED RINGERS IV SOLN: INTRAVENOUS | Status: DC

## 2021-08-24 MED ORDER — DEXAMETHASONE SODIUM PHOSPHATE 10 MG/ML IJ SOLN
INTRAMUSCULAR | Status: DC | PRN
  Administered 2021-08-24: 8 mg via INTRAVENOUS

## 2021-08-24 MED ORDER — MIDAZOLAM HCL 2 MG/2ML IJ SOLN
INTRAMUSCULAR | Status: AC
  Administered 2021-08-24: 2 mg via INTRAVENOUS
  Filled 2021-08-24: qty 2

## 2021-08-24 SURGICAL SUPPLY — 73 items
BAG COUNTER SPONGE SURGICOUNT (BAG) ×2 IMPLANT
BIT DRILL SOLID 2.0X40MM (BIT) IMPLANT
BIT DRILL SOLID 2.5X40MM (BIT) IMPLANT
BLADE CLIPPER SURG (BLADE) IMPLANT
BNDG ELASTIC 3X5.8 VLCR STR LF (GAUZE/BANDAGES/DRESSINGS) ×2 IMPLANT
BNDG ELASTIC 4X5.8 VLCR STR LF (GAUZE/BANDAGES/DRESSINGS) ×2 IMPLANT
BNDG ESMARK 4X9 LF (GAUZE/BANDAGES/DRESSINGS) ×2 IMPLANT
BNDG GAUZE ELAST 4 BULKY (GAUZE/BANDAGES/DRESSINGS) ×2 IMPLANT
CANISTER SUCT 3000ML PPV (MISCELLANEOUS) ×2 IMPLANT
CHLORAPREP W/TINT 26 (MISCELLANEOUS) ×1 IMPLANT
CORD BIPOLAR FORCEPS 12FT (ELECTRODE) ×2 IMPLANT
COVER SURGICAL LIGHT HANDLE (MISCELLANEOUS) ×2 IMPLANT
CUFF TOURN SGL QUICK 18X4 (TOURNIQUET CUFF) ×2 IMPLANT
CUFF TOURN SGL QUICK 24 (TOURNIQUET CUFF)
CUFF TRNQT CYL 24X4X16.5-23 (TOURNIQUET CUFF) IMPLANT
DECANTER SPIKE VIAL GLASS SM (MISCELLANEOUS) ×1 IMPLANT
DRAPE OEC MINIVIEW 54X84 (DRAPES) ×2 IMPLANT
DRAPE SURG 17X11 SM STRL (DRAPES) ×1 IMPLANT
DRILL SOLID 2.0X40MM (BIT) ×2
DRILL SOLID 2.5X40MM (BIT) ×2
DRSG ADAPTIC 3X8 NADH LF (GAUZE/BANDAGES/DRESSINGS) ×1 IMPLANT
GAUZE 4X4 16PLY ~~LOC~~+RFID DBL (SPONGE) ×2 IMPLANT
GAUZE SPONGE 4X4 12PLY STRL (GAUZE/BANDAGES/DRESSINGS) ×1 IMPLANT
GAUZE XEROFORM 1X8 LF (GAUZE/BANDAGES/DRESSINGS) ×1 IMPLANT
GAUZE XEROFORM 5X9 LF (GAUZE/BANDAGES/DRESSINGS) ×1 IMPLANT
GLOVE SURG ORTHO LTX SZ8 (GLOVE) ×2 IMPLANT
GLOVE SURG UNDER POLY LF SZ8.5 (GLOVE) ×2 IMPLANT
GOWN STRL REUS W/ TWL LRG LVL3 (GOWN DISPOSABLE) ×1 IMPLANT
GOWN STRL REUS W/ TWL XL LVL3 (GOWN DISPOSABLE) ×1 IMPLANT
GOWN STRL REUS W/TWL LRG LVL3 (GOWN DISPOSABLE) ×1
GOWN STRL REUS W/TWL XL LVL3 (GOWN DISPOSABLE) ×1
GUIDE AIMING 1.5MM (WIRE) ×2 IMPLANT
KIT BASIN OR (CUSTOM PROCEDURE TRAY) ×2 IMPLANT
KIT TURNOVER KIT B (KITS) ×2 IMPLANT
NDL HYPO 25X1 1.5 SAFETY (NEEDLE) ×1 IMPLANT
NEEDLE HYPO 25X1 1.5 SAFETY (NEEDLE) IMPLANT
NS IRRIG 1000ML POUR BTL (IV SOLUTION) ×2 IMPLANT
PACK ORTHO EXTREMITY (CUSTOM PROCEDURE TRAY) ×2 IMPLANT
PAD ARMBOARD 7.5X6 YLW CONV (MISCELLANEOUS) ×3 IMPLANT
PAD CAST 4YDX4 CTTN HI CHSV (CAST SUPPLIES) ×1 IMPLANT
PADDING CAST ABS 6INX4YD NS (CAST SUPPLIES) ×1
PADDING CAST ABS COTTON 6X4 NS (CAST SUPPLIES) IMPLANT
PADDING CAST COTTON 4X4 STRL (CAST SUPPLIES)
PEG GEMINUS LOCK HCLP 2.7X20 (Peg) ×1 IMPLANT
PEG GEMINUS SMOOTH LOCK 2.0X19 (Peg) ×3 IMPLANT
PEG GEMINUS THRD LOCK 2.3X20 (Peg) ×1 IMPLANT
PEG GEMINUS THRD TPNL 2.7X22 (Peg) ×3 IMPLANT
PEG SMOOTH LOCK 2.0X18 (Screw) ×1 IMPLANT
PEG SMOOTH LOCKING 20MM (Peg) ×1 IMPLANT
PLATE STD 3H LEFT (Plate) ×1 IMPLANT
ROPE TRACTION 120 (SOFTGOODS) ×1 IMPLANT
SCREW GEMINUS PANL 3.5X15 (Screw) ×1 IMPLANT
SCREW POLY NON LOCK 3.5MMX12MM (Screw) ×2 IMPLANT
SCREW POLY NON LOCK 3.5MMX14MM (Screw) ×1 IMPLANT
SCREWDRIVER SURG ST 2 (INSTRUMENTS) ×2 IMPLANT
SOAP 2 % CHG 4 OZ (WOUND CARE) ×2 IMPLANT
SPLINT FIBERGLASS 4X30 (CAST SUPPLIES) ×1 IMPLANT
SPONGE T-LAP 4X18 ~~LOC~~+RFID (SPONGE) ×1 IMPLANT
SUT ETHILON 4 0 FS 1 (SUTURE) ×2 IMPLANT
SUT MNCRL AB 3-0 PS2 18 (SUTURE) ×1 IMPLANT
SUT PROLENE 4 0 PS 2 18 (SUTURE) IMPLANT
SUT VIC AB 2-0 CT1 27 (SUTURE)
SUT VIC AB 2-0 CT1 TAPERPNT 27 (SUTURE) IMPLANT
SUT VICRYL 4-0 PS2 18IN ABS (SUTURE) IMPLANT
SYR CONTROL 10ML LL (SYRINGE) IMPLANT
TOWEL GREEN STERILE (TOWEL DISPOSABLE) ×2 IMPLANT
TOWEL GREEN STERILE FF (TOWEL DISPOSABLE) IMPLANT
TRAP DIGIT (INSTRUMENTS) ×1 IMPLANT
TUBE CONNECTING 12X1/4 (SUCTIONS) ×2 IMPLANT
WATER STERILE IRR 1000ML POUR (IV SOLUTION) ×2 IMPLANT
WIRE FIX 1.5 STANDARD TIP (WIRE) ×4
WIRE FIX 1.5 STD TIP (WIRE) IMPLANT
YANKAUER SUCT BULB TIP NO VENT (SUCTIONS) IMPLANT

## 2021-08-24 NOTE — Brief Op Note (Signed)
08/24/2021  8:01 PM  PATIENT:  Mike West  36 y.o. male  PRE-OPERATIVE DIAGNOSIS:  Left distal radius fracture  POST-OPERATIVE DIAGNOSIS:  Left distal radius fracture  PROCEDURE:  Procedure(s): Left OPEN REDUCTION INTERNAL FIXATION (ORIF) DISTAL RADIAL FRACTURE (Left)  SURGEON:  Surgeon(s) and Role:    * Sherilyn Cooter, MD - Primary  PHYSICIAN ASSISTANT:   ASSISTANTS: none   ANESTHESIA:   regional and MAC  EBL:  20 mL   BLOOD ADMINISTERED:none  DRAINS: none   LOCAL MEDICATIONS USED:  NONE  SPECIMEN:  No Specimen  DISPOSITION OF SPECIMEN:  N/A  COUNTS:  YES  TOURNIQUET:  * Missing tourniquet times found for documented tourniquets in log: [id number] *  DICTATION: .Dragon Dictation  PLAN OF CARE: Discharge to home after PACU  PATIENT DISPOSITION:  PACU - hemodynamically stable.   Delay start of Pharmacological VTE agent (>24hrs) due to surgical blood loss or risk of bleeding: not applicable

## 2021-08-24 NOTE — Anesthesia Postprocedure Evaluation (Signed)
Anesthesia Post Note  Patient: Artist  Procedure(s) Performed: Left OPEN REDUCTION INTERNAL FIXATION (ORIF) DISTAL RADIAL FRACTURE (Left: Wrist)     Patient location during evaluation: PACU Anesthesia Type: General Level of consciousness: awake and alert Pain management: pain level controlled Vital Signs Assessment: post-procedure vital signs reviewed and stable Respiratory status: spontaneous breathing, nonlabored ventilation, respiratory function stable and patient connected to nasal cannula oxygen Cardiovascular status: blood pressure returned to baseline and stable Postop Assessment: no apparent nausea or vomiting Anesthetic complications: no   No notable events documented.  Last Vitals:  Vitals:   08/24/21 2021 08/24/21 2036  BP: 116/75 120/78  Pulse: 83 82  Resp: 15 13  Temp:  36.7 C  SpO2: 99% 97%    Last Pain:  Vitals:   08/24/21 2036  TempSrc:   PainSc: 2                  Tiajuana Amass

## 2021-08-24 NOTE — Discharge Instructions (Signed)
° ° °Nicholes Hibler, M.D. °Hand Surgery ° °POST-OPERATIVE DISCHARGE INSTRUCTIONS ° ° °PRESCRIPTIONS: °You have been given a prescription to be taken as directed for post-operative pain control.  You may also take over the counter ibuprofen/aleve and tylenol for pain. Take this as directed on the packaging. Do not exceed 3000 mg tylenol/acetaminophen in 24 hours. ° °Ibuprofen 600-800 mg (3-4) tablets by mouth every 6 hours as needed for pain.  °OR °Aleve 2 tablets by mouth every 12 hours (twice daily) as needed for pain.  °AND/OR °Tylenol 1000 mg (2 tablets) every 8 hours as needed for pain. ° °Please use your pain medication carefully, as refills are limited and you may not be provided with one.  As stated above, please use over the counter pain medicine - it will also be helpful with decreasing your swelling.  ° ° °ANESTHESIA: °After your surgery, post-surgical discomfort or pain is likely. This discomfort can last several days to a few weeks. At certain times of the day your discomfort may be more intense.  ° °Did you receive a nerve block?  °A nerve block can provide pain relief for one hour to two days after your surgery. As long as the nerve block is working, you will experience little or no sensation in the area the surgeon operated on.  °As the nerve block wears off, you will begin to experience pain or discomfort. It is very important that you begin taking your prescribed pain medication before the nerve block fully wears off. Treating your pain at the first sign of the block wearing off will ensure your pain is better controlled and more tolerable when full-sensation returns. Do not wait until the pain is intolerable, as the medicine will be less effective. It is better to treat pain in advance than to try and catch up.  ° °General Anesthesia:  °If you did not receive a nerve block during your surgery, you will need to start taking your pain medication shortly after your surgery and should continue to do  so as prescribed by your surgeon.   ° ° °ICE AND ELEVATION: °You may use ice for the first 48-72 hours, but it is not critical.   °Motion of your fingers is very important s to decrease the swelling.  °Elevation, as much as possible for the next 48 hours, is critical for decreasing swelling as well as for pain relief. Elevation means when you are seated or lying down, you hand should be at or above your heart. When walking, the hand needs to be at or above the level of your elbow.  °If the bandage gets too tight, it may need to be loosened. Please contact our office and we will instruct you in how to do this.  ° ° °SURGICAL BANDAGES:  °Keep your dressing and/or splint clean and dry at all times.  Do not remove until you are seen again in the office.  If careful, you may place a plastic bag over your bandage and tape the end to shower, but be careful, do not get your bandages wet.  °  ° °HAND THERAPY:  °You may not need any. If you do, we will begin this at your follow up visit in the clinic.  ° ° °ACTIVITY AND WORK: °You are encouraged to move any fingers which are not in the bandage.  °Light use of the fingers is allowed to assist the other hand with daily hygiene and eating, but strong gripping or lifting is often uncomfortable and   should be avoided.  °You might miss a variable period of time from work and hopefully this issue has been discussed prior to surgery. You may not do any heavy work with your affected hand for about 2 weeks.  ° ° °[facility] °[address] °[place],  [place]  [address] °[phone]  °

## 2021-08-24 NOTE — Transfer of Care (Signed)
Immediate Anesthesia Transfer of Care Note  Patient: Mike West  Procedure(s) Performed: Left OPEN REDUCTION INTERNAL FIXATION (ORIF) DISTAL RADIAL FRACTURE (Left: Wrist)  Patient Location: PACU  Anesthesia Type:General  Level of Consciousness: awake, alert  and oriented  Airway & Oxygen Therapy: Patient Spontanous Breathing  Post-op Assessment: Report given to RN and Post -op Vital signs reviewed and stable  Post vital signs: Reviewed and stable  Last Vitals:  Vitals Value Taken Time  BP 117/77 08/24/21 2007  Temp    Pulse 85 08/24/21 2008  Resp 18 08/24/21 2008  SpO2 96 % 08/24/21 2008  Vitals shown include unvalidated device data.  Last Pain:  Vitals:   08/24/21 1350  TempSrc:   PainSc: 9          Complications: No notable events documented.

## 2021-08-24 NOTE — Anesthesia Procedure Notes (Signed)
Anesthesia Regional Block: Supraclavicular block   Pre-Anesthetic Checklist: , timeout performed,  Correct Patient, Correct Site, Correct Laterality,  Correct Procedure, Correct Position, site marked,  Risks and benefits discussed,  Surgical consent,  Pre-op evaluation,  At surgeon's request and post-op pain management  Laterality: Left  Prep: chloraprep       Needles:  Injection technique: Single-shot  Needle Type: Echogenic Stimulator Needle     Needle Length: 5cm  Needle Gauge: 22     Additional Needles:   Procedures:, nerve stimulator,,, ultrasound used (permanent image in chart),,     Nerve Stimulator or Paresthesia:  Response: hand, 0.45 mA  Additional Responses:   Narrative:  Start time: 08/24/2021 3:26 PM End time: 08/24/2021 3:30 PM Injection made incrementally with aspirations every 5 mL.  Performed by: Personally  Anesthesiologist: Bethena Midget, MD  Additional Notes: Functioning IV was confirmed and monitors were applied.  A 62mm 22ga Arrow echogenic stimulator needle was used. Sterile prep and drape,hand hygiene and sterile gloves were used. Ultrasound guidance: relevant anatomy identified, needle position confirmed, local anesthetic spread visualized around nerve(s)., vascular puncture avoided.  Image printed for medical record. Negative aspiration and negative test dose prior to incremental administration of local anesthetic. The patient tolerated the procedure well.

## 2021-08-24 NOTE — Anesthesia Preprocedure Evaluation (Signed)
Anesthesia Evaluation  Patient identified by MRN, date of birth, ID band Patient awake    Reviewed: Allergy & Precautions, H&P , NPO status , Patient's Chart, lab work & pertinent test results, reviewed documented beta blocker date and time   Airway Mallampati: I  TM Distance: >3 FB Neck ROM: full    Dental no notable dental hx. (+) Teeth Intact, Dental Advisory Given   Pulmonary neg pulmonary ROS,    Pulmonary exam normal breath sounds clear to auscultation       Cardiovascular Exercise Tolerance: Good negative cardio ROS   Rhythm:regular Rate:Normal     Neuro/Psych negative neurological ROS  negative psych ROS   GI/Hepatic negative GI ROS, Neg liver ROS,   Endo/Other  negative endocrine ROS  Renal/GU negative Renal ROS  negative genitourinary   Musculoskeletal   Abdominal   Peds  Hematology negative hematology ROS (+)   Anesthesia Other Findings   Reproductive/Obstetrics negative OB ROS                             Anesthesia Physical Anesthesia Plan  ASA: 1 and emergent  Anesthesia Plan: General and Regional   Post-op Pain Management: Regional block   Induction: Intravenous  PONV Risk Score and Plan: 2 and Ondansetron, Dexamethasone and Treatment may vary due to age or medical condition  Airway Management Planned: LMA and Oral ETT  Additional Equipment: None  Intra-op Plan:   Post-operative Plan: Extubation in OR  Informed Consent: I have reviewed the patients History and Physical, chart, labs and discussed the procedure including the risks, benefits and alternatives for the proposed anesthesia with the patient or authorized representative who has indicated his/her understanding and acceptance.     Dental Advisory Given  Plan Discussed with: CRNA and Anesthesiologist  Anesthesia Plan Comments: ( )        Anesthesia Quick Evaluation

## 2021-08-24 NOTE — Op Note (Signed)
Date of Surgery: 08/24/2021  INDICATIONS: Mr. Mike West is a 36 y.o.-year-old male with left intra-articular distal radius fracture after falling from his skateboard around 1 week ago.  Risks, benefits, and alternatives to surgery were again discussed today with the patient wishing to proceed with surgery.  Informed consent was signed after our discussion.   PREOPERATIVE DIAGNOSIS: 1. Left intra-articular distal radius fracture  POSTOPERATIVE DIAGNOSIS: Same.  PROCEDURE:  Open reduction internal fixation left distal radius fracture, 3+ fragments   SURGEON: Waylan Rocher, M.D.  ASSIST:   ANESTHESIA:  general  IV FLUIDS AND URINE: See anesthesia.  ESTIMATED BLOOD LOSS: 20 mL.  IMPLANTS:  Implant Name Type Inv. Item Serial No. Manufacturer Lot No. LRB No. Used Action  PLATE STD 3H LEFT - [id number] Plate PLATE STD 3H LEFT  SKELETAL DYNAMICS ON TRAY Left 1 Implanted  SCREW POLY NON LOCK 3.5HWD61HO - [id number] Screw SCREW POLY NON LOCK 3.8IEB90ON  SKELETAL DYNAMICS ON TRAY Left 2 Implanted  PEG GEMINUS SMOOTH LOCK 2.0X19 - [id number] Peg PEG GEMINUS SMOOTH LOCK 2.0X19  SKELETAL DYNAMICS ON TRAY Left 3 Implanted  HI COMP LOCK 2.7MMX20MM    SKELETAL DYNAMICS ON TRAY Left 1 Implanted  THREAD PEG NON LOCK 2.7MM X    SKELETAL DYNAMICS ON TRAY Left 3 Implanted  PEG SMOOTH LOCKING - [id number] Peg PEG SMOOTH LOCKING  SKELETAL DYNAMICS ON TRAY Left 1 Implanted  PEG SMOOTH LOCK 2.0X18 - [id number] Screw PEG SMOOTH LOCK 2.0X18  SKELETAL DYNAMICS ON TRAY Left 1 Implanted  THREAD PEG LOCK 2.3MMX20MM    SKELETAL DYNAMICS ON TRAY Left 1 Implanted  SCREW POLY NON LOCK 3.4TFV27IF - [id number] Screw SCREW POLY NON LOCK 3.5NFU87MP  SKELETAL DYNAMICS ON TRAY Left 1 Implanted  CORTICAL NON-LOCK [id number]    SKELETAL DYNAMICS ON TRAY Left 1 Implanted     DRAINS: None  COMPLICATIONS: see description of procedure.  DESCRIPTION OF PROCEDURE: The patient was met in the preoperative holding  area where the surgical site was marked and the consent form was verified.  The patient was then taken to the operating room and transferred to the operating table.  All bony prominences were well padded.  A tourniquet was applied to the left upper arm.  The operative extremity was prepped and draped in the usual and sterile fashion.  A formal time-out was performed to confirm that this was the correct patient, surgery, side, and site.   Following timeout, the limb was exsanguinated with an Esmarch bandage and the tourniquet inflated to 250 mmHg.  Traction was applied to the extremity via the index and middle fingers with 10 pounds of weight.  Extended FCR approach was made to the wrist.  Skin subcutaneous tissue was divided.  Blunt dissection was used and the distal aspect of wound identify the superficial branch of radial artery which was protected.  The FCR volar sheath was incised in its entirety.  The FCR retracted radially and the floor of the sheath was also incised.  Blunt dissection was used to identify Parona space by retracting the FPL tendon ulnarly.  The pronator quadratus was identified.  Taken down sharply and a L-type fashion across the distal and radial aspects of the insertion on the radius.  Blunt dissection was used to identify the brachioradialis tendon as it inserted on the radial styloid.  This was released and a step cut type fashion.  The first compartment was visualized and protected.  The quadratus was elevated sharply off of the distal  aspect the radius and the fibers now using a 15 blade.  Was then dissected free from the wrist to the radius subperiosteally with an elevator.  The volar Carron Brazen type fracture was identified.  Hematoma and soft tissue was debrided from the fracture site using a combination of a curette and rondure.  The fracture was reduced with direct pressure on the fragment.  A standard 3-hole Geminus plate was selected.  Appropriate plate position was confirmed on both  AP and lateral views and a cortical screw was placed in the oblong hole.  K wires were placed and the aiming guides on both the radial and ulnar heads of the plate.  30 degree lateral view showed that these wires were in appropriate position.  A second cortical screw was placed in the shaft to further compress the plate against the radius.  A bicortical screw was placed in both the radial and ulnar heads of the plate to compress the plate down to the distal radius.  The remaining distal holes were filled with smooth locking pegs.  The previously placed bicortical screws were also replaced with smooth locking pegs.  Care was taken to ensure that the smooth pegs did not penetrate the dorsal cortex.  The final proximal cortical screw was then placed.  Final AP and lateral views demonstrate appropriate plate position and fracture reduction.  A 30 degree lateral view demonstrates that all the screws were extra-articular.  The DRUJ was examined in both neutral, supination, and pronation and found to be stable.  The wounds were then thoroughly irrigated with copious sterile saline.  The pronator quadratus was repaired.  The tourniquet was deflated and hemostasis achieved with bipolar electrocautery and direct pressure. The wounds then closed in a layered fashion using 3-0 buried Monocryl sutures followed by 4-0 nylon suture in horizontal mattress fashion.  The wounds were dressed with xeroform, 4x4, cast padding, and a well padded volar splint.  The patient was reversed from anesthesia and extubated uneventfully.  He was transferred to the postoperative bed and taken to PACU in stable condition.  All counts were correct at the end of the procedure.  POSTOPERATIVE PLAN: He will be discharged to home with appropriate pain medication and discharge instructions.  I will see him back in two weeks for his first postop visit.  We will start him in therapy to work on ROM and edema control.   Audria Nine, MD 8:08 PM

## 2021-08-24 NOTE — Interval H&P Note (Signed)
History and Physical Interval Note:  08/24/2021 3:23 PM  Mike West  has presented today for surgery, with the diagnosis of Left distal radius fracture.  The various methods of treatment have been discussed with the patient and family. After consideration of risks, benefits and other options for treatment, the patient has consented to  Procedure(s): Left OPEN REDUCTION INTERNAL FIXATION (ORIF) DISTAL RADIAL FRACTURE (Left) as a surgical intervention.  The patient's history has been reviewed, patient examined, no change in status, stable for surgery.  I have reviewed the patient's chart and labs.  Questions were answered to the patient's satisfaction.      Elliette Seabolt

## 2021-08-25 ENCOUNTER — Other Ambulatory Visit: Payer: Self-pay

## 2021-08-25 ENCOUNTER — Ambulatory Visit: Payer: BC Managed Care – PPO | Admitting: Orthopedic Surgery

## 2021-08-25 ENCOUNTER — Telehealth: Payer: Self-pay | Admitting: Orthopedic Surgery

## 2021-08-25 NOTE — Telephone Encounter (Signed)
Pt dropped off some FMLA forms that was giving to Dr. Frazier Butt after lunch. Pt called requesting a call back when forms are ready for pick up after Dr. Frazier Butt fills them out. Pt states he has to turn him in to his employer. Please call pt when ready for pick up. Pt phone number is [phone].

## 2021-08-25 NOTE — Telephone Encounter (Signed)
Patient is aware his form is ready for pick up tomorrow

## 2021-08-26 ENCOUNTER — Encounter (HOSPITAL_COMMUNITY): Payer: Self-pay | Admitting: Orthopedic Surgery

## 2021-08-31 ENCOUNTER — Telehealth: Payer: Self-pay | Admitting: Orthopedic Surgery

## 2021-08-31 NOTE — Telephone Encounter (Signed)
Patient is having a hard time sleeping at night due to increasing pain. He is requesting a sleep aid to help stay asleep at night.

## 2021-09-03 ENCOUNTER — Ambulatory Visit (INDEPENDENT_AMBULATORY_CARE_PROVIDER_SITE_OTHER): Payer: BC Managed Care – PPO | Admitting: Orthopedic Surgery

## 2021-09-03 ENCOUNTER — Other Ambulatory Visit: Payer: Self-pay

## 2021-09-03 ENCOUNTER — Ambulatory Visit (INDEPENDENT_AMBULATORY_CARE_PROVIDER_SITE_OTHER): Payer: BC Managed Care – PPO

## 2021-09-03 ENCOUNTER — Telehealth: Payer: Self-pay | Admitting: Orthopedic Surgery

## 2021-09-03 ENCOUNTER — Other Ambulatory Visit: Payer: Self-pay | Admitting: Orthopedic Surgery

## 2021-09-03 DIAGNOSIS — S52572A Other intraarticular fracture of lower end of left radius, initial encounter for closed fracture: Secondary | ICD-10-CM

## 2021-09-03 MED ORDER — TRAMADOL HCL 50 MG PO TABS: 50.0000 mg | ORAL_TABLET | Freq: Four times a day (QID) | ORAL | 0 refills | Status: AC | PRN

## 2021-09-03 NOTE — Progress Notes (Signed)
° °  Post-Op Visit Note   Patient: Mike West           Date of Birth: 08-12-1985           MRN: [id number] Visit Date: 09/03/2021 PCP: Melida Quitter, MD   Assessment & Plan:  Chief Complaint:  Chief Complaint  Patient presents with   Left Wrist - Routine Post Op    08/24/21 left wrist ORIF, 3+fragments   Visit Diagnoses:  1. Other closed intra-articular fracture of distal end of left radius, initial encounter     Plan: Patient is doing well postoperatively.  His numbness and paresthesias have resolved.  His pain is tolerable and impoving.  X-rays today show appropriate plate position and fracture alignment. We will start him in therapy.  His incision is clean and dry.  Sutures removed.  I'll see him back in two weeks.   Follow-Up Instructions: No follow-ups on file.   Orders:  Orders Placed This Encounter  Procedures   XR Wrist 2 Views Left   No orders of the defined types were placed in this encounter.   Imaging: No results found.  PMFS History: Patient Active Problem List   Diagnosis Date Noted   Closed fracture of left distal radius 08/20/2021   Past Medical History:  Diagnosis Date   ADHD (attention deficit hyperactivity disorder)    GERD (gastroesophageal reflux disease)    History of kidney stones    Pasted    Family History  Problem Relation Age of Onset   Healthy Mother    Healthy Father     Past Surgical History:  Procedure Laterality Date   OPEN REDUCTION INTERNAL FIXATION (ORIF) DISTAL RADIAL FRACTURE Left 08/24/2021   Procedure: Left OPEN REDUCTION INTERNAL FIXATION (ORIF) DISTAL RADIAL FRACTURE;  Surgeon: Marlyne Beards, MD;  Location: [facility];  Service: Orthopedics;  Laterality: Left;   SHOULDER SURGERY Left    VASECTOMY     Social History   Occupational History   Not on file  Tobacco Use   Smoking status: Never   Smokeless tobacco: Never  Vaping Use   Vaping Use: Never used  Substance and Sexual Activity   Alcohol use: Not  Currently    Comment: 1/27/ 23- no longer   Drug use: Never   Sexual activity: Not on file

## 2021-09-03 NOTE — Telephone Encounter (Signed)
Pt called asking for his tramadol script. He said the [facility]   [address] [place], [place], [place] [address]  CB

## 2021-09-03 NOTE — Addendum Note (Signed)
Addended by: Marlyne Beards on: 09/03/2021 01:29 PM   Modules accepted: Orders

## 2021-09-04 ENCOUNTER — Other Ambulatory Visit: Payer: Self-pay

## 2021-09-04 ENCOUNTER — Telehealth: Payer: Self-pay | Admitting: Orthopedic Surgery

## 2021-09-04 ENCOUNTER — Ambulatory Visit (INDEPENDENT_AMBULATORY_CARE_PROVIDER_SITE_OTHER): Payer: BC Managed Care – PPO | Admitting: Rehabilitative and Restorative Service Providers"

## 2021-09-04 ENCOUNTER — Encounter: Payer: Self-pay | Admitting: Rehabilitative and Restorative Service Providers"

## 2021-09-04 DIAGNOSIS — M6281 Muscle weakness (generalized): Secondary | ICD-10-CM | POA: Diagnosis not present

## 2021-09-04 DIAGNOSIS — M25532 Pain in left wrist: Secondary | ICD-10-CM

## 2021-09-04 DIAGNOSIS — M25632 Stiffness of left wrist, not elsewhere classified: Secondary | ICD-10-CM

## 2021-09-04 NOTE — Telephone Encounter (Signed)
Patient is here for OT. Says he did not get his pain medication called in. He would like Tramadol called in for pain.

## 2021-09-04 NOTE — Therapy (Signed)
OUTPATIENT OCCUPATIONAL THERAPY ORTHO EVALUATION  Patient Name: Marius Betts MRN: [id number] DOB:10/27/1985, 36 y.o., male Today's Date: 09/04/2021  PCP: Michael Boston, MD REFERRING PROVIDER: Sherilyn Cooter, MD   OT End of Session - 09/04/21 [phone]     Visit Number 1    Number of Visits 16    Date for OT Re-Evaluation 10/30/21    Progress Note Due on Visit 10    OT Start Time 0800    OT Stop Time 0915    OT Time Calculation (min) 75 min    Equipment Utilized During Treatment materials for orthotic    Activity Tolerance Patient tolerated treatment well;No increased pain    Behavior During Therapy WFL for tasks assessed/performed             Past Medical History:  Diagnosis Date   ADHD (attention deficit hyperactivity disorder)    GERD (gastroesophageal reflux disease)    History of kidney stones    Pasted   Past Surgical History:  Procedure Laterality Date   OPEN REDUCTION INTERNAL FIXATION (ORIF) DISTAL RADIAL FRACTURE Left 08/24/2021   Procedure: Left OPEN REDUCTION INTERNAL FIXATION (ORIF) DISTAL RADIAL FRACTURE;  Surgeon: Sherilyn Cooter, MD;  Location: [facility];  Service: Orthopedics;  Laterality: Left;   SHOULDER SURGERY Left    VASECTOMY     Patient Active Problem List   Diagnosis Date Noted   Closed fracture of left distal radius 08/20/2021    ONSET DATE: 08/18/21 DOI, 08/24/21 DOS   REFERRING DIAG: O45.997F (ICD-10-CM) - Other closed intra-articular fracture of distal end of left radius, initial encounter  THERAPY DIAG:  Pain in left wrist  Muscle weakness (generalized)  Stiffness of left wrist, not elsewhere classified  SUBJECTIVE:   SUBJECTIVE STATEMENT: Pt states falling while skateboarding, breaking wrist. 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 He does state some nausea and discomfort during eval, advised to take pain meds if needed as e states not taking anything yet.    PERTINENT HISTORY: Distal radius fracture s/p ORIF 08/24/21 (11 days post-op)   PAIN:  Are you having pain? Yes NPRS scale: 7/10 Pain location: L wrist  Pain orientation: Left  PAIN TYPE: aching, sharp, and throbbing Pain description: constant  Aggravating factors: weight bearing, etc.  Relieving factors: ice, rest  PRECAUTIONS: wrist protection brace until 5-6 weeks, no strengthening until 6-8 weeks as tol   HAND DOMINANCE: Right  WEIGHT BEARING RESTRICTIONS Yes avoid heavy until 6-8 weeks  FALLS: Has patient fallen in last 6 months? Yes, Number of falls: 1 (this accident)   PLOF: Independent  PATIENT GOALS: get motion back, get back to work and gym   OBJECTIVE:   DIAGNOSTIC FINDINGS:   COGNITION: Overall cognitive status: Within functional limits for tasks assessed  ADLs: Overall ADLs: Pt unable to perform basic tasks with sore and stiff lt hand now  FUNCTIONAL OUTCOME MEASURES: Quick Dash TBD next session   SENSATION: Light touch: Appears intact Stereognosis: Appears intact Hot/Cold: Appears intact Proprioception: Appears intact   COORDINATION:   Box and Blocks:TBD Comments: Overtly impaired today, cannot make fist or oppose, will need addressed.   EDEMA: mild swelling about the Lt wrist   MUSCLE TONE: inhibited and weak in Lt wrist/hand, some muscle wasting in Lt FA   SKIN INTEGRITY: sx areas just  recently closed/healed, will avoid scar massage for 1 more week.   PALPATION: TTP to Lt wrist as  typical for condition  UE AROM/PROM:  A/PROM Right 09/04/2021 Left 09/04/2021  Wrist flexion  0  Wrist extension  15  Wrist ulnar deviation    Wrist radial deviation    Wrist pronation  75  Wrist supination  30  (Blank rows = not tested)  HAND A/PROM: unable to make full fist, tested in composite and has 5.5cm from pad MF to [facility]   UE MMT: NOT TESTED at [facility]   MMT Left 09/04/2021  Shoulder flexion   Shoulder abduction   Shoulder adduction    Shoulder extension   Shoulder internal rotation   Shoulder external rotation   Middle trapezius   Lower trapezius   Elbow flexion   Elbow extension   Wrist flexion   Wrist extension   Wrist ulnar deviation   Wrist radial deviation   Wrist pronation   Wrist supination   (Blank rows = not tested)  HAND FUNCTION:  Grip strength: Right: 97# lbs; Left: NT lbs Comments: Will perform Lt hand when appropriate   TODAY'S TREATMENT:  09/04/21: Eval & OT fabricates custom wrist immobilizer, leaving thumb and fingers free, in approx neutral wrist position. Fits well, no areas of pressure, pt edu on donning/doffing, taking off for exercises and hygiene, on at other times for protection.   He is also edu and given HEP handout for A/ROM at shoulder, elbow, forearm, wrist and hand gently within tolerance. He demo's back all well, no pain, is advised to use ice for soreness/swelling.    PATIENT EDUCATION: Education details: See above tx session for info Person educated: Patient Education method: Explanation, Demonstration, Tactile cues, Verbal cues, and Handouts Education comprehension: verbalized understanding, returned demonstration, verbal cues required, and needs further education   HOME EXERCISE PROGRAM: Access Code: [email or web address] URL: [email or web address] Date: 09/04/2021 Prepared by: Benito Mccreedy  Exercises Standing Elbow Flexion Extension AROM - 4-6 x daily - 10-15 reps Standing Forearm Pronation and Supination AROM - 4-6 x daily - 10-15 reps Wrist AROM Flexion Extension - 4-6 x daily - 10-15 reps Seated Finger Composite Flexion with Putty - 4-6 x daily - 10-15 reps Thumb Opposition - 4-6 x daily - 10-15 reps   ASSESSMENT:  CLINICAL IMPRESSION: Patient is a 36 y.o. male who was seen today for occupational therapy evaluation and treatment for Lt wrist pain, stiffness, etc.. Patient has performance deficits in functional skills including ADLs, IADLs, coordination,  dexterity, edema, tone, ROM, strength, pain, fascial restrictions, flexibility, FMC, GMC, endurance, decreased knowledge of precautions, skin integrity, and UE functional use and psychosocial skills including coping strategies. These impairments are limiting patient from ADLs, IADLs, rest and sleep, work, play, leisure, and social participation. Patient may have co-morbidities  that affects occupational performance. Patient will benefit from skilled OT to address above impairments and improve overall function.  MODIFICATION OR ASSISTANCE TO COMPLETE EVALUATION: No modification of tasks or assist necessary to complete an evaluation.  OT OCCUPATIONAL PROFILE AND HISTORY: Problem focused assessment: Including review of records relating to presenting problem.  CLINICAL DECISION MAKING: LOW - limited treatment options, no task modification necessary  REHAB POTENTIAL: Excellent  EVALUATION COMPLEXITY: Low     GOALS: Goals reviewed with patient? 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  3 Improve Lt grip strength to at least 70# with no pain for hard work tasks 10/30/21 INITIAL  4 Improve Lt wrist flex and ext A/ROM to at least 50* for each for fnl wrist motion to hold objects  10/30/21 INITIAL   PLAN: OT FREQUENCY: 1-2x/week  OT DURATION: 8 weeks  PLANNED INTERVENTIONS: self care/ADL training, therapeutic exercise, therapeutic activity, neuromuscular re-education, manual therapy, scar mobilization, passive range of motion, splinting, ultrasound, fluidotherapy, compression bandaging, moist heat,  cryotherapy, contrast bath, patient/family education, and coping strategies training  PLAN FOR NEXT SESSION: Perform Quick DASH and discuss/address any fnl concerns, review HEP, use no weight fnl activities (box&blocks) to stimulate motion/function. Manual and modalities as needed as well as orthotic adjustments  RECOMMENDED OTHER SERVICES: none now   CONSULTED AND AGREED WITH PLAN OF CARE: Patient  Benito Mccreedy, OTR/L, CHT 09/04/2021, 1:47 PM

## 2021-09-11 ENCOUNTER — Other Ambulatory Visit: Payer: Self-pay | Admitting: Orthopedic Surgery

## 2021-09-11 ENCOUNTER — Ambulatory Visit (INDEPENDENT_AMBULATORY_CARE_PROVIDER_SITE_OTHER): Payer: BC Managed Care – PPO | Admitting: Rehabilitative and Restorative Service Providers"

## 2021-09-11 ENCOUNTER — Encounter: Payer: Self-pay | Admitting: Rehabilitative and Restorative Service Providers"

## 2021-09-11 ENCOUNTER — Telehealth: Payer: Self-pay | Admitting: Orthopedic Surgery

## 2021-09-11 ENCOUNTER — Other Ambulatory Visit: Payer: Self-pay

## 2021-09-11 DIAGNOSIS — M25532 Pain in left wrist: Secondary | ICD-10-CM

## 2021-09-11 DIAGNOSIS — M25632 Stiffness of left wrist, not elsewhere classified: Secondary | ICD-10-CM

## 2021-09-11 DIAGNOSIS — M6281 Muscle weakness (generalized): Secondary | ICD-10-CM | POA: Diagnosis not present

## 2021-09-11 MED ORDER — TRAMADOL HCL 50 MG PO TABS
100.0000 mg | ORAL_TABLET | Freq: Four times a day (QID) | ORAL | 0 refills | Status: DC | PRN
Start: 2021-09-11 — End: 2021-09-18

## 2021-09-11 NOTE — Telephone Encounter (Signed)
Pt states his arm is still giving him trouble especially after he has been using it more. He would like a refill on tramadol.

## 2021-09-11 NOTE — Therapy (Signed)
OUTPATIENT OCCUPATIONAL THERAPY TREATMENT NOTE   Patient Name: Mike West MRN: [id number] DOB:Sep 01, 1985, 36 y.o., male Today's Date: 09/11/2021  PCP: Michael Boston, MD REFERRING PROVIDER: Sherilyn Cooter, MD   OT End of Session - 09/11/21 [phone]     Visit Number 2    Number of Visits 16    Date for OT Re-Evaluation 10/30/21    Progress Note Due on Visit --    OT Start Time 0931    OT Stop Time 1019    OT Time Calculation (min) 48 min    Equipment Utilized During Treatment materials for orthotic    Activity Tolerance Patient tolerated treatment well;No increased pain    Behavior During Therapy WFL for tasks assessed/performed             Past Medical History:  Diagnosis Date   ADHD (attention deficit hyperactivity disorder)    GERD (gastroesophageal reflux disease)    History of kidney stones    Pasted   Past Surgical History:  Procedure Laterality Date   OPEN REDUCTION INTERNAL FIXATION (ORIF) DISTAL RADIAL FRACTURE Left 08/24/2021   Procedure: Left OPEN REDUCTION INTERNAL FIXATION (ORIF) DISTAL RADIAL FRACTURE;  Surgeon: Sherilyn Cooter, MD;  Location: [facility];  Service: Orthopedics;  Laterality: Left;   SHOULDER SURGERY Left    VASECTOMY     Patient Active Problem List   Diagnosis Date Noted   Closed fracture of left distal radius 08/20/2021    ONSET DATE: 08/18/21 DOI, 08/24/21 DOS    REFERRING DIAG: J19.147W (ICD-10-CM) - Other closed intra-articular fracture of distal end of left radius, initial encounter  THERAPY DIAG:  Pain in left wrist  Muscle weakness (generalized)  Stiffness of left wrist, not elsewhere classified   PERTINENT HISTORY: 16 days postop now. Distal radius fracture s/p ORIF 08/24/21   PRECAUTIONS:wrist protection brace until 5-6 weeks, no strengthening until 6-8 weeks as tol   SUBJECTIVE: He states doing HEP 3-4x day, avoiding using hand, sometimes swelling. He took a hot shower and states trying to massage & "work" hand and it  hurts now (maybe from heat/swelling). He states return to work on Monday and he'll be doing light duties.   PAIN:  Are you having pain? Yes NPRS scale: 7/10 Pain location: diffuse about Lt wrist  Pain orientation: Left  PAIN TYPE: aching, dull, and throbbing Pain description: intermittent  Aggravating factors: attempted use Relieving factors: ice, rest, etc.     OBJECTIVE: (All Objective from eval on 09/04/21, unless otherwise specified)  DIAGNOSTIC FINDINGS: N/A    ADLs: Overall ADLs: Pt unable to perform basic tasks with sore and stiff lt hand now   FUNCTIONAL OUTCOME MEASURES: 09/11/21: Katina Dung 54.5% functional impairment (issues with sleep, utensils, opening containers, leisure, etc.)      SENSATION: Light touch: Appears intact Stereognosis: Appears intact Hot/Cold: Appears intact Proprioception: Appears intact     COORDINATION:   Box and Blocks:TBD Comments: Overtly impaired today, cannot make fist or oppose, will need addressed.    EDEMA: mild swelling about the Lt wrist, given compression tubular dressing 09/11/22 to help    MUSCLE TONE: inhibited and weak in Lt wrist/hand, some muscle wasting in Lt FA    SKIN INTEGRITY: sx areas just  recently closed/healed, will avoid scar massage for 1 more week.    PALPATION: TTP to Lt wrist as typical for condition   UE AROM/PROM:   A/PROM Left 09/04/2021  Wrist flexion 0  Wrist extension 15  Wrist ulnar deviation  Wrist radial deviation    Wrist pronation 75  Wrist supination 30  (Blank rows = not tested)   HAND A/PROM: unable to make full fist, tested in composite and has 5.5cm from pad MF to [facility]     UE MMT: NOT TESTED at [facility]    MMT Left 09/04/2021  Shoulder flexion    Shoulder abduction    Shoulder adduction    Shoulder extension    Shoulder internal rotation    Shoulder external rotation    Middle trapezius    Lower trapezius    Elbow flexion    Elbow extension    Wrist flexion    Wrist  extension    Wrist ulnar deviation    Wrist radial deviation    Wrist pronation    Wrist supination    (Blank rows = not tested)   HAND FUNCTION:   Grip strength: Right: 97# lbs; Left: NT lbs Comments: Will perform Lt hand when appropriate    TODAY'S TREATMENT:  09/11/21: Review and performance of HEP (at listed below) then OT also adds light P/ROM as tol @ wrist, FA and fingers as well (as listed below). OT does manual retrograde massage and scar massages with pt today, with edu to add to HEP. He tolerates well with some soreness, so ice was encouraged after session.   Exercises Standing Elbow Flexion Extension AROM - 4-6 x daily - 10-15 reps Standing Forearm Pronation and Supination AROM - 4-6 x daily - 10-15 reps Wrist AROM Flexion Extension - 4-6 x daily - 10-15 reps Seated Finger Composite Flexion with Putty - 4-6 x daily - 10-15 reps Thumb Opposition - 4-6 x daily - 10-15 reps Forearm Supination Stretch - 2-3 x daily - 3-5 reps - 15 sec hold Forearm Pronation Stretch - 2-3 x daily - 3-5 reps - 15 sec hold Wrist Prayer Stretch - 2-3 x daily - 3-5 reps - 15 sec hold Seated Wrist Flexion with Overpressure - 2-3 x daily - 3-5 reps - 15 sec hold    09/04/21: Eval & OT fabricates custom wrist immobilizer, leaving thumb and fingers free, in approx neutral wrist position. Fits well, no areas of pressure, pt edu on donning/doffing, taking off for exercises and hygiene, on at other times for protection.   He is also edu and given HEP handout for A/ROM at shoulder, elbow, forearm, wrist and hand gently within tolerance. He demo's back all well, no pain, is advised to use ice for soreness/swelling.      PATIENT EDUCATION: Education details: See above tx session for info Person educated: Patient Education method: Explanation, Demonstration, Tactile cues, Verbal cues, and Handouts Education comprehension: verbalized understanding, returned demonstration, verbal cues required, and needs  further education     HOME EXERCISE PROGRAM: Access Code: [email or web address] URL: [email or web address] Prepared by: Benito Mccreedy    ASSESSMENT:   CLINICAL IMPRESSION:  09/11/21:Pt has been overusing hand/wrist resulting in pain and maintaining swelling. Focus now is on more rest, reducing swelling and gently, pain-free increasing ROM. He has also been very painful, sensitive, can't sleep.  He did speak to Dr. Tempie Donning about it today.      GOALS: Goals reviewed with patient? 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  3 Improve Lt grip strength to at least 70# with no pain for hard work tasks 10/30/21 INITIAL  4 Improve Lt wrist flex and ext A/ROM to at least 50* for each for fnl wrist motion to hold objects  10/30/21 INITIAL  5 Improve impairment from 54% per Quick DASH to 15% or better for better quality of life 10/30/21 New Goal on 09/11/21    PLAN: OT FREQUENCY: 1-2x/week   OT DURATION: 8 weeks   PLANNED INTERVENTIONS: self care/ADL training, therapeutic exercise, therapeutic activity, neuromuscular re-education, manual therapy, scar mobilization, passive range of motion, splinting, ultrasound, fluidotherapy, compression bandaging, moist heat, cryotherapy, contrast bath, patient/family education, and coping strategies training   PLAN FOR NEXT SESSION: Review HEP and NWB, try no weight fnl activities (box&blocks) as tolerated, keep on modalities and manual therapy as needed.   RECOMMENDED OTHER SERVICES: none now    CONSULTED AND AGREED WITH PLAN OF CARE: Patient   Benito Mccreedy, OTR/L, CHT 09/11/2021, 12:54 PM

## 2021-09-17 ENCOUNTER — Ambulatory Visit (INDEPENDENT_AMBULATORY_CARE_PROVIDER_SITE_OTHER): Payer: BC Managed Care – PPO | Admitting: Orthopedic Surgery

## 2021-09-17 ENCOUNTER — Other Ambulatory Visit: Payer: Self-pay

## 2021-09-17 ENCOUNTER — Ambulatory Visit: Payer: Self-pay

## 2021-09-17 DIAGNOSIS — S52572A Other intraarticular fracture of lower end of left radius, initial encounter for closed fracture: Secondary | ICD-10-CM

## 2021-09-17 NOTE — Progress Notes (Signed)
° °  Post-Op Visit Note   Patient: Mike West           Date of Birth: 1986/03/08           MRN: [id number] Visit Date: 09/17/2021 PCP: Melida Quitter, MD   Assessment & Plan:  Chief Complaint:  Chief Complaint  Patient presents with   Left Wrist - Routine Post Op   Visit Diagnoses:  1. Other closed intra-articular fracture of distal end of left radius, initial encounter     Plan: Patient doing well 3.5 weeks out from his injury.  He is able to me a complete composite fist.  His swelling is much improved.  His middle finger numbness has resolves.  He is still working hard in therapy.  X-rays show maintained fracture alignment and plate position.  I can see him back in several weeks to see how he's progressing with therapy.   Follow-Up Instructions: No follow-ups on file.   Orders:  Orders Placed This Encounter  Procedures   XR Wrist 2 Views Left   No orders of the defined types were placed in this encounter.   Imaging: No results found.  PMFS History: Patient Active Problem List   Diagnosis Date Noted   Closed fracture of left distal radius 08/20/2021   Past Medical History:  Diagnosis Date   ADHD (attention deficit hyperactivity disorder)    GERD (gastroesophageal reflux disease)    History of kidney stones    Pasted    Family History  Problem Relation Age of Onset   Healthy Mother    Healthy Father     Past Surgical History:  Procedure Laterality Date   OPEN REDUCTION INTERNAL FIXATION (ORIF) DISTAL RADIAL FRACTURE Left 08/24/2021   Procedure: Left OPEN REDUCTION INTERNAL FIXATION (ORIF) DISTAL RADIAL FRACTURE;  Surgeon: Marlyne Beards, MD;  Location: [facility];  Service: Orthopedics;  Laterality: Left;   SHOULDER SURGERY Left    VASECTOMY     Social History   Occupational History   Not on file  Tobacco Use   Smoking status: Never   Smokeless tobacco: Never  Vaping Use   Vaping Use: Never used  Substance and Sexual Activity   Alcohol use: Not  Currently    Comment: 1/27/ 23- no longer   Drug use: Never   Sexual activity: Not on file

## 2021-09-18 ENCOUNTER — Ambulatory Visit (INDEPENDENT_AMBULATORY_CARE_PROVIDER_SITE_OTHER): Payer: BC Managed Care – PPO | Admitting: Rehabilitative and Restorative Service Providers"

## 2021-09-18 ENCOUNTER — Other Ambulatory Visit: Payer: Self-pay | Admitting: Orthopedic Surgery

## 2021-09-18 DIAGNOSIS — M25532 Pain in left wrist: Secondary | ICD-10-CM | POA: Diagnosis not present

## 2021-09-18 DIAGNOSIS — M6281 Muscle weakness (generalized): Secondary | ICD-10-CM | POA: Diagnosis not present

## 2021-09-18 DIAGNOSIS — M25632 Stiffness of left wrist, not elsewhere classified: Secondary | ICD-10-CM

## 2021-09-18 MED ORDER — TRAMADOL HCL 50 MG PO TABS: 100.0000 mg | ORAL_TABLET | Freq: Four times a day (QID) | ORAL | 0 refills | Status: AC | PRN

## 2021-09-18 NOTE — Telephone Encounter (Signed)
Pt came into the office for PT. He states he called the pharm this morning and they do not have his tramadol. Can we please refill this?   CB [phone]

## 2021-09-18 NOTE — Therapy (Signed)
OUTPATIENT OCCUPATIONAL THERAPY TREATMENT NOTE   Patient Name: Mike West MRN: [id number] DOB:[birth year], 36 y.o., male Today's Date: 09/18/2021  PCP: Michael Boston, MD REFERRING PROVIDER: Sherilyn Cooter, MD   OT End of Session - 09/18/21 0934     Visit Number 3    Number of Visits 16    Date for OT Re-Evaluation 10/30/21    OT Start Time 0934    OT Stop Time 1023    OT Time Calculation (min) 49 min    Equipment Utilized During Treatment --    Activity Tolerance Patient tolerated treatment well;No increased pain    Behavior During Therapy WFL for tasks assessed/performed              Past Medical History:  Diagnosis Date   ADHD (attention deficit hyperactivity disorder)    GERD (gastroesophageal reflux disease)    History of kidney stones    Pasted   Past Surgical History:  Procedure Laterality Date   OPEN REDUCTION INTERNAL FIXATION (ORIF) DISTAL RADIAL FRACTURE Left 08/24/2021   Procedure: Left OPEN REDUCTION INTERNAL FIXATION (ORIF) DISTAL RADIAL FRACTURE;  Surgeon: Sherilyn Cooter, MD;  Location: [place];  Service: Orthopedics;  Laterality: Left;   SHOULDER SURGERY Left    VASECTOMY     Patient Active Problem List   Diagnosis Date Noted   Closed fracture of left distal radius 08/20/2021    ONSET DATE: 08/18/21 DOI, 08/24/21 DOS    REFERRING DIAG: Q22.297L (ICD-10-CM) - Other closed intra-articular fracture of distal end of left radius, initial encounter  THERAPY DIAG:  Pain in left wrist  Muscle weakness (generalized)  Stiffness of left wrist, not elsewhere classified   PERTINENT HISTORY: ~5 weeks postop now. Distal radius fracture s/p ORIF 08/24/21   PRECAUTIONS:wrist protection brace until 5-6 weeks, no strengthening until 6-8 weeks as tol   SUBJECTIVE:  He states better pain, but he still can't sleep, light duty work went well, but he does work at night & sleep in day and schedule is off.    PAIN:  Are you having pain? Yes NPRS scale:  5/10 Pain location: diffuse about Lt wrist  Pain orientation: Left  PAIN TYPE: stabbing, throbbing Pain description: intermittent  Aggravating factors: attempted use Relieving factors: ice, rest, etc.     OBJECTIVE: (All Objective from eval on 09/04/21, unless otherwise specified)  DIAGNOSTIC FINDINGS: N/A    ADLs: Overall ADLs: Pt unable to perform basic tasks with sore and stiff lt hand now   FUNCTIONAL OUTCOME MEASURES: 09/11/21: Katina Dung 54.5% functional impairment (issues with sleep, utensils, opening containers, leisure, etc.)      SENSATION: Light touch: Appears intact Stereognosis: Appears intact Hot/Cold: Appears intact Proprioception: Appears intact     COORDINATION:   Box and Blocks:44 blocks 09/18/21 Comments:   EDEMA: mild swelling about the Lt wrist, given compression tubular dressing 09/11/22 to help    MUSCLE TONE: inhibited and weak in Lt wrist/hand, some muscle wasting in Lt FA    SKIN INTEGRITY: sx areas just  recently closed/healed, will avoid scar massage for 1 more week.    PALPATION: TTP to Lt wrist as typical for condition   UE AROM/PROM:   A/PROM Left 09/04/2021 Lt  09/18/21   Wrist flexion 0 16  Wrist extension 15 40  Wrist ulnar deviation     Wrist radial deviation     Wrist pronation 75   Wrist supination 30   (Blank rows = not tested)   HAND A/PROM: 09/18/21: now  can make full fist      UE MMT: NOT TESTED at [facility]    MMT Left 09/04/2021  Shoulder flexion    Shoulder abduction    Shoulder adduction    Shoulder extension    Shoulder internal rotation    Shoulder external rotation    Middle trapezius    Lower trapezius    Elbow flexion    Elbow extension    Wrist flexion    Wrist extension    Wrist ulnar deviation    Wrist radial deviation    Wrist pronation    Wrist supination    (Blank rows = not tested)   HAND FUNCTION:   Grip strength: Right: 97# lbs; Left: NT lbs Comments: Will perform Lt hand when appropriate     TODAY'S TREATMENT:  09/18/21: Measures are taken and wrist motion is improving significantly, but he is still guarded. Due to low pain threshold, OT encourages to keep HEP "light" and adds new P/ROM in deviations at wrist with long handled tool- he can also do pro/sup like this as well. Other wrist stretches preformed as well as A/ROM now with full fist (as he is now able). OT also re-edu on no weight fnl activities and has him perform box and blocks test and continue to perform blocks activities with which he has no pain today. He does state trying to fold laundry at home. OT finishes with 3 mins ice which relieves pain and OT also edu on other fnl activities to add before bed to promote circulation, and a more restful sleep. He was edu to start weaning from orthotic when home, resting for 1-2 hours a day as tolerated. He states understanding, leaves with no significant pain report.   09/11/21: Review and performance of HEP (at listed below) then OT also adds light P/ROM as tol @ wrist, FA and fingers as well (as listed below). OT does manual retrograde massage and scar massages with pt today, with edu to add to HEP. He tolerates well with some soreness, so ice was encouraged after session.   Exercises Standing Elbow Flexion Extension AROM - 4-6 x daily - 10-15 reps Standing Forearm Pronation and Supination AROM - 4-6 x daily - 10-15 reps Wrist AROM Flexion Extension - 4-6 x daily - 10-15 reps Seated Finger Composite Flexion with Putty - 4-6 x daily - 10-15 reps Thumb Opposition - 4-6 x daily - 10-15 reps Forearm Supination Stretch - 2-3 x daily - 3-5 reps - 15 sec hold Forearm Pronation Stretch - 2-3 x daily - 3-5 reps - 15 sec hold Wrist Prayer Stretch - 2-3 x daily - 3-5 reps - 15 sec hold Seated Wrist Flexion with Overpressure - 2-3 x daily - 3-5 reps - 15 sec hold     PATIENT EDUCATION: Education details: See above tx session for info Person educated: Patient Education method:  Explanation, Demonstration, Tactile cues, Verbal cues, and Handouts Education comprehension: verbalized understanding, returned demonstration, verbal cues required, and needs further education     HOME EXERCISE PROGRAM: Access Code: [email or web address] URL: [email or web address] Prepared by: Benito Mccreedy    ASSESSMENT:   CLINICAL IMPRESSION:  09/18/21: Still fairly high pain, guarding, but improving motion and functional ability. Carry on weaning and light fnl activities to try to get over fear and anxiety.   09/11/21:Pt has been overusing hand/wrist resulting in pain and maintaining swelling. Focus now is on more rest, reducing swelling and gently, pain-free increasing ROM. He has also been very painful, sensitive,  can't sleep.  He did speak to Dr. Tempie Donning about it today.      GOALS: Goals reviewed with patient? 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  3 Improve Lt grip strength to at least 70# with no pain for hard work tasks 10/30/21 INITIAL  4 Improve Lt wrist flex and ext A/ROM to at least 50* for each for fnl wrist motion to hold objects  10/30/21 INITIAL  5 Improve impairment from 54% per Quick DASH to 15% or better for better quality of life 10/30/21 New Goal on 09/11/21    PLAN: OT FREQUENCY: 1-2x/week   OT DURATION: 8 weeks   PLANNED INTERVENTIONS: self care/ADL training, therapeutic exercise, therapeutic activity, neuromuscular re-education, manual therapy, scar mobilization, passive range of motion, splinting,  ultrasound, fluidotherapy, compression bandaging, moist heat, cryotherapy, contrast bath, patient/family education, and coping strategies training   PLAN FOR NEXT SESSION: Review wean orthotic, review HEP, do more fnl activities and start PRE only when tolerated at this point. Try IASTM for tissues adhesions that may be causing pain/restrictions.    RECOMMENDED OTHER SERVICES: none now    CONSULTED AND AGREED WITH PLAN OF CARE: Patient   Benito Mccreedy, OTR/L, CHT 09/18/2021, 10:31 AM

## 2021-09-25 ENCOUNTER — Ambulatory Visit (INDEPENDENT_AMBULATORY_CARE_PROVIDER_SITE_OTHER): Payer: BC Managed Care – PPO | Admitting: Rehabilitative and Restorative Service Providers"

## 2021-09-25 ENCOUNTER — Other Ambulatory Visit: Payer: Self-pay

## 2021-09-25 DIAGNOSIS — M6281 Muscle weakness (generalized): Secondary | ICD-10-CM | POA: Diagnosis not present

## 2021-09-25 DIAGNOSIS — M25632 Stiffness of left wrist, not elsewhere classified: Secondary | ICD-10-CM | POA: Diagnosis not present

## 2021-09-25 DIAGNOSIS — M25532 Pain in left wrist: Secondary | ICD-10-CM | POA: Diagnosis not present

## 2021-09-25 NOTE — Therapy (Signed)
?OUTPATIENT OCCUPATIONAL THERAPY TREATMENT NOTE ? ? ?Patient Name: Mike West ?MRN: [id number] ?DOB:29-Aug-1985, 36 y.o., male ?Today's Date: 09/25/2021 ? ?PCP: Michael Boston, MD ?REFERRING PROVIDER: Sherilyn Cooter, MD ? ? OT End of Session - 09/25/21 4158   ? ? Visit Number 4   ? Number of Visits 16   ? Date for OT Re-Evaluation 10/30/21   ? OT Start Time [phone]   ? OT Stop Time 1015   ? OT Time Calculation (min) 47 min   ? Activity Tolerance Patient tolerated treatment well;Patient limited by pain   ? Behavior During Therapy [facility] for tasks assessed/performed   ? ?  ?  ? ?  ? ? ? ? ?Past Medical History:  ?Diagnosis Date  ? ADHD (attention deficit hyperactivity disorder)   ? GERD (gastroesophageal reflux disease)   ? History of kidney stones   ? Pasted  ? ?Past Surgical History:  ?Procedure Laterality Date  ? OPEN REDUCTION INTERNAL FIXATION (ORIF) DISTAL RADIAL FRACTURE Left 08/24/2021  ? Procedure: Left OPEN REDUCTION INTERNAL FIXATION (ORIF) DISTAL RADIAL FRACTURE;  Surgeon: Sherilyn Cooter, MD;  Location: [place];  Service: Orthopedics;  Laterality: Left;  ? SHOULDER SURGERY Left   ? VASECTOMY    ? ?Patient Active Problem List  ? Diagnosis Date Noted  ? Closed fracture of left distal radius 08/20/2021  ? ? ?ONSET DATE: 08/18/21 DOI, 08/24/21 DOS  ?  ?REFERRING DIAG: M76.808U (ICD-10-CM) - Other closed intra-articular fracture of distal end of left radius, initial encounter ? ?THERAPY DIAG:  ?Stiffness of left wrist, not elsewhere classified ? ?Muscle weakness (generalized) ? ?Pain in left wrist ? ? ?PERTINENT HISTORY: ~5.5 weeks postop now. Distal radius fracture s/p ORIF 08/24/21  ? ?PRECAUTIONS:wrist protection brace until 5-6 weeks, no strengthening until 6-8 weeks as tol  ? ?SUBJECTIVE:  ?He states getting more sleep, feeling a little better.  ? ? ?PAIN:  ?Are you having pain? Yes ?NPRS scale: 3/10 ?Pain location: diffuse about Lt wrist  ?Pain orientation: Left  ?PAIN TYPE: stabbing, throbbing ?Pain  description: intermittent  ?Aggravating factors: attempted use ?Relieving factors: ice, rest, etc.  ? ? ?OBJECTIVE: (All Objective from eval on 09/04/21, unless otherwise specified) ? ?DIAGNOSTIC FINDINGS: N/A ? ?  ?ADLs: ?Overall ADLs: Pt unable to perform basic tasks with sore and stiff lt hand now ?  ?FUNCTIONAL OUTCOME MEASURES: ?09/11/21: Katina Dung 54.5% functional impairment (issues with sleep, utensils, opening containers, leisure, etc.)  ?  ?  ?SENSATION: ?Light touch: Appears intact ?Stereognosis: Appears intact ?Hot/Cold: Appears intact ?Proprioception: Appears intact ?  ?  ?COORDINATION: ?  ?Box and Blocks:44 blocks 09/18/21 ?Comments: ?  ?EDEMA: mild swelling about the Lt wrist, given compression tubular dressing 09/11/22 to help  ?  ?MUSCLE TONE: inhibited and weak in Lt wrist/hand, some muscle wasting in Lt FA  ?  ?SKIN INTEGRITY: sx areas just  recently closed/healed, will avoid scar massage for 1 more week.  ?  ?PALPATION: TTP to Lt wrist as typical for condition ?  ?UE AROM/PROM: ?  ?A/PROM Left ?09/04/2021 Lt  ?09/18/21 ? Lt  ?09/25/21  ?Wrist flexion 0 16 20  ?Wrist extension 15 40 40  ?Wrist ulnar deviation      ?Wrist radial deviation      ?Wrist pronation 75    ?Wrist supination 30    ?(Blank rows = not tested) ?  ?HAND A/PROM: 09/18/21: now can make full fist  ?  ?  ?UE MMT: NOT TESTED at [facility]  ?  ?  ?  HAND FUNCTION: ?  ?Grip strength: Right: 97# lbs; Left: NT lbs ?Comments: Will perform Lt hand when appropriate  ?  ?TODAY'S TREATMENT:  ?09/25/21: Pt warms up in fluidotherapy for 5 mins concurrently performing A/ROM HEP review for flex, ext, deviations to tolerance. He feels looser/better at end. OT performs manual therapy with IASTM that he tolerates well and attempt to increase wrist flexion more. Next, he starts light isometric gripping 5x 5-10sec 1-2 x day as tolerated, he has some pain with that, is encouraged to stop ASAP if sharply painful. While OT reviews HEP and keeping stretches and gripping  tolerable, he uses 3 mins ice at end and states lowered pain.  ? ?09/18/21: Measures are taken and wrist motion is improving significantly, but he is still guarded. Due to low pain threshold, OT encourages to keep HEP "light" and adds new P/ROM in deviations at wrist with long handled tool- he can also do pro/sup like this as well. Other wrist stretches preformed as well as A/ROM now with full fist (as he is now able). OT also re-edu on no weight fnl activities and has him perform box and blocks test and continue to perform blocks activities with which he has no pain today. He does state trying to fold laundry at home. OT finishes with 3 mins ice which relieves pain and OT also edu on other fnl activities to add before bed to promote circulation, and a more restful sleep. He was edu to start weaning from orthotic when home, resting for 1-2 hours a day as tolerated. He states understanding, leaves with no significant pain report.  ? ?  ?PATIENT EDUCATION: ?Education details: See above tx session for info ?Person educated: Patient ?Education method: Explanation, Demonstration, Tactile cues, Verbal cues, and Handouts ?Education comprehension: verbalized understanding, returned demonstration, verbal cues required, and needs further education ?  ?  ?HOME EXERCISE PROGRAM: ?Access Code: [email or web address] ?URL: [email or web address] ?Prepared by: Benito Mccreedy ?   ?ASSESSMENT: ?  ?CLINICAL IMPRESSION: ? 09/25/21: Slowly, steadily improving now, continue  ? ?09/18/21: Still fairly high pain, guarding, but improving motion and functional ability. Carry on weaning and light fnl activities to try to get over fear and anxiety.  ? ?09/11/21:Pt has been overusing hand/wrist resulting in pain and maintaining swelling. Focus now is on more rest, reducing swelling and gently, pain-free increasing ROM. He has also been very painful, sensitive, can't sleep.  He did speak to Dr. Tempie Donning about it today.  ?  ?  ?GOALS: ?Goals reviewed  with patient? Yes ?  ?SHORT TERM GOALS: (STG required if POC>30 days) ?  ?STG Name Target Date Goal status  ?1 Obtain protective orthotic  09/04/21 MET  ?2 Demo back initial HEP for A/ROM without pain or issues  09/18/21 09/18/21: partially met- is able to demo but has some pain still  ?  ?LONG TERM GOALS:  ?  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  ?PLAN: ?OT FREQUENCY: 1-2x/week ?  ?OT DURATION: 8 weeks ?  ?PLANNED INTERVENTIONS: self care/ADL training, therapeutic exercise, therapeutic activity, neuromuscular re-education, manual therapy, scar mobilization, passive range of motion, splinting, ultrasound, fluidotherapy, compression bandaging, moist heat, cryotherapy, contrast bath, patient/family education, and coping strategies training ?  ?PLAN FOR NEXT SESSION: Keep on gaining A/ROM, adding more P/ROM and toughen up with isometric strength as tolerated, test grip strength as tol ?  ?RECOMMENDED OTHER SERVICES: none now  ?  ?CONSULTED AND AGREED WITH PLAN OF CARE: Patient ? ? ?Benito Mccreedy, OTR/L, CHT ?09/25/2021, 12:34 PM ? ?  ? ?  ?

## 2021-10-01 ENCOUNTER — Ambulatory Visit (INDEPENDENT_AMBULATORY_CARE_PROVIDER_SITE_OTHER): Payer: BC Managed Care – PPO | Admitting: Rehabilitative and Restorative Service Providers"

## 2021-10-01 ENCOUNTER — Ambulatory Visit (INDEPENDENT_AMBULATORY_CARE_PROVIDER_SITE_OTHER): Payer: BC Managed Care – PPO | Admitting: Orthopedic Surgery

## 2021-10-01 ENCOUNTER — Other Ambulatory Visit: Payer: Self-pay

## 2021-10-01 ENCOUNTER — Ambulatory Visit: Payer: Self-pay

## 2021-10-01 ENCOUNTER — Encounter: Payer: Self-pay | Admitting: Orthopedic Surgery

## 2021-10-01 VITALS — Ht 66.0 in | Wt 153.0 lb

## 2021-10-01 DIAGNOSIS — M25632 Stiffness of left wrist, not elsewhere classified: Secondary | ICD-10-CM

## 2021-10-01 DIAGNOSIS — M25532 Pain in left wrist: Secondary | ICD-10-CM | POA: Diagnosis not present

## 2021-10-01 DIAGNOSIS — S52572A Other intraarticular fracture of lower end of left radius, initial encounter for closed fracture: Secondary | ICD-10-CM

## 2021-10-01 DIAGNOSIS — M6281 Muscle weakness (generalized): Secondary | ICD-10-CM

## 2021-10-01 MED ORDER — TRAMADOL HCL 50 MG PO TABS: 50.0000 mg | ORAL_TABLET | Freq: Two times a day (BID) | ORAL | 0 refills | Status: AC | PRN

## 2021-10-01 NOTE — Therapy (Signed)
?OUTPATIENT OCCUPATIONAL THERAPY TREATMENT NOTE ? ? ?Patient Name: Mike West ?MRN: [id number] ?DOB:[birth year], 36 y.o., male ?Today's Date: 10/01/2021 ? ?PCP: Michael Boston, MD ?REFERRING PROVIDER: Sherilyn Cooter, MD ? ? OT End of Session - 10/01/21 0932   ? ? Visit Number 5   ? Number of Visits 16   ? Date for OT Re-Evaluation 10/30/21   ? OT Start Time 0932   ? OT Stop Time 1015   ? OT Time Calculation (min) 43 min   ? Activity Tolerance Patient tolerated treatment well;Patient limited by pain   ? Behavior During Therapy [facility] for tasks assessed/performed   ? ?  ?  ? ?  ? ? ? ? ? ?Past Medical History:  ?Diagnosis Date  ? ADHD (attention deficit hyperactivity disorder)   ? GERD (gastroesophageal reflux disease)   ? History of kidney stones   ? Pasted  ? ?Past Surgical History:  ?Procedure Laterality Date  ? OPEN REDUCTION INTERNAL FIXATION (ORIF) DISTAL RADIAL FRACTURE Left 08/24/2021  ? Procedure: Left OPEN REDUCTION INTERNAL FIXATION (ORIF) DISTAL RADIAL FRACTURE;  Surgeon: Sherilyn Cooter, MD;  Location: [place];  Service: Orthopedics;  Laterality: Left;  ? SHOULDER SURGERY Left   ? VASECTOMY    ? ?Patient Active Problem List  ? Diagnosis Date Noted  ? Closed fracture of left distal radius 08/20/2021  ? ? ?ONSET DATE: 08/18/21 DOI, 08/24/21 DOS  ?  ?REFERRING DIAG: H08.657Q (ICD-10-CM) - Other closed intra-articular fracture of distal end of left radius, initial encounter ? ?THERAPY DIAG:  ?Pain in left wrist ? ?Muscle weakness (generalized) ? ?Stiffness of left wrist, not elsewhere classified ? ? ?PERTINENT HISTORY: ~5.5 weeks postop now. Distal radius fracture s/p ORIF 08/24/21  ? ?PRECAUTIONS:wrist protection brace until 5-6 weeks, no strengthening until 6-8 weeks as tol  ? ?SUBJECTIVE:  ?He states steadily improving, less uncomfortable. 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  ? ?PAIN:  ?Are you having pain? Yes ?NPRS scale: 2/10 ?Pain location: diffuse about Lt wrist  ?Pain  orientation: Left  ?PAIN TYPE: stabbing, throbbing ?Pain description: intermittent  ?Aggravating factors: attempted use ?Relieving factors: ice, rest, etc.  ? ? ?OBJECTIVE: (All Objective from eval on 09/04/21, unless otherwise specified) ? ?DIAGNOSTIC FINDINGS: N/A ? ?  ?ADLs: ?Overall ADLs: Pt unable to perform basic tasks with sore and stiff lt hand now ?  ?FUNCTIONAL OUTCOME MEASURES: ?09/11/21: Katina Dung 54.5% functional impairment (issues with sleep, utensils, opening containers, leisure, etc.)  ?  ?  ?SENSATION: ?Light touch: Appears intact ?Stereognosis: Appears intact ?Hot/Cold: Appears intact ?Proprioception: Appears intact ?  ?  ?COORDINATION: ?  ?Box and Blocks:44 blocks 09/18/21 ?Comments: ?  ?EDEMA: mild swelling about the Lt wrist, given compression tubular dressing 09/11/22 to help  ?  ?MUSCLE TONE: inhibited and weak in Lt wrist/hand, some muscle wasting in Lt FA  ?  ?SKIN INTEGRITY: sx areas just  recently closed/healed, will avoid scar massage for 1 more week.  ?  ?PALPATION: TTP to Lt wrist as typical for condition ?  ?UE AROM/PROM: ?  ?A/PROM Left ?09/04/2021 Lt  ?09/18/21 ? Lt  ?09/25/21  ?Wrist flexion 0 16 20  ?Wrist extension 15 40 40  ?Wrist ulnar deviation      ?Wrist radial deviation      ?Wrist pronation 75    ?Wrist supination 30    ?(Blank rows = not tested) ?  ?HAND A/PROM: 09/18/21: now can make full fist  ?  ?  ?  UE MMT: NOT TESTED at [facility]  ?  ?  ?HAND FUNCTION: ?  ?Grip strength: Right: 97# lbs; Left: NT lbs ?Comments: Will perform Lt hand when appropriate  ?10/01/21: 17.7# no pain ?  ?TODAY'S TREATMENT:  ?10/01/21: Pt reaches and grasps for small objects in fluido machine 5 mins. OT performs manual therapy traction, stretches, joint oscillations for FA, wrist in all planes today. He states feeling looser. Next he performs pegs activity to promote wrist flexion and supination. He was given theraputty to strengthen pinch and grasp, but unfortunately had MF locking at A1 pulley. He states never  experiencing this before. Grasp was downgraded to isometric wide grip which again was tolerated well. He was edu to avoid tight closed fist to prevent locking and do wrist ext stretches with fingers held back as well. He states understanding.  ? ?09/25/21: Pt warms up in fluidotherapy for 5 mins concurrently performing A/ROM HEP review for flex, ext, deviations to tolerance. He feels looser/better at end. OT performs manual therapy with IASTM that he tolerates well and attempt  to increase wrist flexion more. Next, he starts light isometric gripping 5x 5-10sec 1-2 x day as tolerated, he has some pain with that, is encouraged to stop ASAP if sharply painful. While OT reviews HEP and keeping stretches and gripping tolerable, he uses 3 mins ice at end and states lowered pain.  ? ?09/18/21: Measures are taken and wrist motion is improving significantly, but he is still guarded. Due to low pain threshold, OT encourages to keep HEP "light" and adds new P/ROM in deviations at wrist with long handled tool- he can also do pro/sup like this as well. Other wrist stretches preformed as well as A/ROM now with full fist (as he is now able). OT also re-edu on no weight fnl activities and has him perform box and blocks test and continue to perform blocks activities with which he has no pain today. He does state trying to fold laundry at home. OT finishes with 3 mins ice which relieves pain and OT also edu on other fnl activities to add before bed to promote circulation, and a more restful sleep. He was edu to start weaning from orthotic when home, resting for 1-2 hours a day as tolerated. He states understanding, leaves with no significant pain report.  ? ?  ?PATIENT EDUCATION: ?Education details: See above tx session for info ?Person educated: Patient ?Education method: Explanation, Demonstration, Tactile cues, Verbal cues, and Handouts ?Education comprehension: verbalized understanding, returned demonstration, verbal cues required,  and needs further education ?  ?  ?HOME EXERCISE PROGRAM: ?Access Code: [email or web address] ?URL: [email or web address] ?Prepared by: Benito Mccreedy ?   ?ASSESSMENT: ?  ?CLINICAL IMPRESSION: ? 10/01/21: Locking in Lt hand could hold patient back, but hopefull will resolve once wrist less tight ? ?09/25/21: Slowly, steadily improving now, continue  ? ?09/18/21: Still fairly high pain, guarding, but improving motion and functional ability. Carry on weaning and light fnl activities to try to get over fear and anxiety.  ?  ?  ?GOALS: ?Goals reviewed with patient? Yes ?  ?SHORT TERM GOALS: (STG required if POC>30 days) ?  ?STG Name Target Date Goal status  ?1 Obtain protective orthotic  09/04/21 MET  ?2 Demo back initial HEP for A/ROM without pain or issues  09/18/21 09/18/21: partially met- is able to demo but has some pain still  ?  ?LONG TERM GOALS:  ?  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  ?PLAN: ?OT FREQUENCY: 1-2x/week ?  ?OT DURATION: 8 weeks ?  ?PLANNED INTERVENTIONS: self care/ADL training, therapeutic exercise, therapeutic activity, neuromuscular re-education, manual therapy, scar mobilization, passive range of motion, splinting, ultrasound, fluidotherapy, compression bandaging, moist heat, cryotherapy, contrast bath, patient/family education, and coping strategies training ?  ?PLAN FOR NEXT SESSION:  ?Check HEP, upgrade P/ROM as tolerated, upgrade strengthening as tolerates at  elbow, wrist, etc. as well.  ? ?RECOMMENDED OTHER SERVICES: none now  ?  ?CONSULTED AND AGREED WITH PLAN OF CARE: Patient ? ? ?Benito Mccreedy, OTR/L, CHT ?10/01/2021, 1:07 PM ? ?  ? ?  ?

## 2021-10-01 NOTE — Progress Notes (Signed)
? ?  Post-Op Visit Note ?  ?Patient: Mike West           ?Date of Birth: 1986/06/02           ?MRN: [id number] ?Visit Date: 10/01/2021 ?PCP: Melida Quitter, MD ? ? ?Assessment & Plan: ? ?Chief Complaint:  ?Chief Complaint  ?Patient presents with  ? Left Wrist - Follow-up  ?  08/24/2021 ORIF left distal radius  ? ?Visit Diagnoses:  ?1. Other closed intra-articular fracture of distal end of left radius, initial encounter   ? ? ?Plan: Patient is now approximately 5.5 weeks out from surgery.  He is doing well.  He is working hard with therapy and has significant improvement in his ROM.  He now has 40 deg extension and 30 deg flexion at wrist.  He has no pain during the day but still has difficulty sleeping at night.  He has also developed a new trigger finger of the middle finger.  We will treat this with a corticosteroid injection today.  I can see him back in another month after continued therapy.  ? ?Follow-Up Instructions: No follow-ups on file.  ? ?Orders:  ?Orders Placed This Encounter  ?Procedures  ? XR Wrist 2 Views Left  ? ?Meds ordered this encounter  ?Medications  ? traMADol (ULTRAM) 50 MG tablet  ?  Sig: Take 1 tablet (50 mg total) by mouth every 12 (twelve) hours as needed for up to 7 days.  ?  Dispense:  14 tablet  ?  Refill:  0  ? ? ?Imaging: ?No results found. ? ?PMFS History: ?Patient Active Problem List  ? Diagnosis Date Noted  ? Closed fracture of left distal radius 08/20/2021  ? ?Past Medical History:  ?Diagnosis Date  ? ADHD (attention deficit hyperactivity disorder)   ? GERD (gastroesophageal reflux disease)   ? History of kidney stones   ? Pasted  ?  ?Family History  ?Problem Relation Age of Onset  ? Healthy Mother   ? Healthy Father   ?  ?Past Surgical History:  ?Procedure Laterality Date  ? OPEN REDUCTION INTERNAL FIXATION (ORIF) DISTAL RADIAL FRACTURE Left 08/24/2021  ? Procedure: Left OPEN REDUCTION INTERNAL FIXATION (ORIF) DISTAL RADIAL FRACTURE;  Surgeon: Marlyne Beards, MD;  Location: [facility];  Service: Orthopedics;  Laterality: Left;  ? SHOULDER SURGERY Left   ? VASECTOMY    ? ?Social History  ? ?Occupational History  ? Not on file  ?Tobacco Use  ? Smoking status: Never  ? Smokeless tobacco: Never  ?Vaping Use  ? Vaping Use: Never used  ?Substance and Sexual Activity  ? Alcohol use: Not Currently  ?  Comment: 1/27/ 23- no longer  ? Drug use: Never  ? Sexual activity: Not on file  ? ? ? ?

## 2021-10-08 ENCOUNTER — Ambulatory Visit (INDEPENDENT_AMBULATORY_CARE_PROVIDER_SITE_OTHER): Payer: BC Managed Care – PPO | Admitting: Rehabilitative and Restorative Service Providers"

## 2021-10-08 ENCOUNTER — Other Ambulatory Visit: Payer: Self-pay

## 2021-10-08 DIAGNOSIS — M25532 Pain in left wrist: Secondary | ICD-10-CM

## 2021-10-08 DIAGNOSIS — M25632 Stiffness of left wrist, not elsewhere classified: Secondary | ICD-10-CM

## 2021-10-08 DIAGNOSIS — M6281 Muscle weakness (generalized): Secondary | ICD-10-CM

## 2021-10-08 NOTE — Therapy (Signed)
OUTPATIENT OCCUPATIONAL THERAPY TREATMENT & DISCHARGE NOTE   Patient Name: Mike West MRN: [id number] DOB:Mar 27, 1986, 36 y.o., male Today's Date: 10/08/2021  PCP: Michael Boston, MD REFERRING PROVIDER: Sherilyn Cooter, MD   OT End of Session - 10/08/21 0936     Visit Number 6    Number of Visits 16    Date for OT Re-Evaluation 10/30/21    OT Start Time 0936    OT Stop Time 1021    OT Time Calculation (min) 45 min    Activity Tolerance Patient tolerated treatment well;Patient limited by pain    Behavior During Therapy WFL for tasks assessed/performed                 Past Medical History:  Diagnosis Date   ADHD (attention deficit hyperactivity disorder)    GERD (gastroesophageal reflux disease)    History of kidney stones    Pasted   Past Surgical History:  Procedure Laterality Date   OPEN REDUCTION INTERNAL FIXATION (ORIF) DISTAL RADIAL FRACTURE Left 08/24/2021   Procedure: Left OPEN REDUCTION INTERNAL FIXATION (ORIF) DISTAL RADIAL FRACTURE;  Surgeon: Sherilyn Cooter, MD;  Location: [place];  Service: Orthopedics;  Laterality: Left;   SHOULDER SURGERY Left    VASECTOMY     Patient Active Problem List   Diagnosis Date Noted   Closed fracture of left distal radius 08/20/2021    ONSET DATE: 08/18/21 DOI, 08/24/21 DOS    REFERRING DIAG: Q94.503U (ICD-10-CM) - Other closed intra-articular fracture of distal end of left radius, initial encounter  THERAPY DIAG:  Stiffness of left wrist, not elsewhere classified  Muscle weakness (generalized)  Pain in left wrist   PERTINENT HISTORY: ~5.5 weeks postop now. 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 He states going to gym and testing light wrist weight bearing.   PAIN:  Are you having pain? Yes NPRS  scale: 5/10 at rest now Pain location: diffuse about Lt wrist  Pain orientation: Left  PAIN TYPE: stabbing, throbbing Pain description: intermittent  Aggravating factors: attempted use Relieving factors: ice, rest, etc.    OBJECTIVE: (All Objective from eval on 09/04/21, unless otherwise specified)  DIAGNOSTIC FINDINGS: N/A  ADLs: Overall ADLs: Pt unable to perform basic tasks with sore and stiff lt hand now   FUNCTIONAL OUTCOME MEASURES: 09/11/21: Katina Dung 54.5% functional impairment (issues with sleep, utensils, opening containers, leisure, etc.)      SENSATION: Light touch: Appears intact Stereognosis: Appears intact Hot/Cold: Appears intact Proprioception: Appears intact     COORDINATION:   Box and Blocks:44 blocks 09/18/21 Comments:   EDEMA: mild swelling about the Lt wrist, given compression tubular dressing 09/11/22 to help    MUSCLE TONE: inhibited and weak in Lt wrist/hand, some muscle wasting in Lt FA    SKIN INTEGRITY: sx areas just  recently closed/healed, will avoid scar massage for 1 more week.    PALPATION: TTP to Lt wrist as typical for condition   UE AROM/PROM:   A/PROM Left 09/04/2021 Lt  09/18/21  Lt  09/25/21  Wrist flexion 0 16 20  Wrist extension 15 40 40  Wrist ulnar deviation      Wrist radial deviation      Wrist pronation 75    Wrist supination 30    (Blank rows = not  tested)   HAND A/PROM: 09/18/21: now can make full fist      UE MMT: NOT TESTED at EVAL      HAND FUNCTION:   Grip strength: Right: 97# lbs; Left: NT lbs Comments: Will perform Lt hand when appropriate  10/01/21: 17.7# no pain   TODAY'S TREATMENT:  10/08/21: He starts with A/ROM in fluido for 5 mins there ex, then OT does manual therapy to FA, wrist, hand trying to push more into PROM, using joint mobs at wrist in flex, ext to try to help gain motion. OT also edu on suing weighted wrist stretches now in wrist ext, flexion and forearm pro and supination.  He tolerates  fairly well with light 1-2# weight. He also performs concentric exercises in these motions as well. OT also edu for finger ext with putty and he states understanding. Pt somewhat sore at end from harder stretches, advised to use ice at home, rest when feeling "overworked" at home or work.   10/01/21: Pt reaches and grasps for small objects in fluido machine 5 mins. OT performs manual therapy traction, stretches, joint oscillations for FA, wrist in all planes today. He states feeling looser. Next he performs pegs activity to promote wrist flexion and supination. He was given theraputty to strengthen pinch and grasp, but unfortunately had MF locking at A1 pulley. He states never experiencing this before. Grasp was downgraded to isometric wide grip which again was tolerated well. He was edu to avoid tight closed fist to prevent locking and do wrist ext stretches with fingers held back as well. He states understanding.   09/25/21: Pt warms up in fluidotherapy for 5 mins concurrently performing A/ROM HEP review for flex, ext, deviations to tolerance. He feels looser/better at end. OT performs manual therapy with IASTM that he tolerates well and attempt  to increase wrist flexion more. Next, he starts light isometric gripping 5x 5-10sec 1-2 x day as tolerated, he has some pain with that, is encouraged to stop ASAP if sharply painful. While OT reviews HEP and keeping stretches and gripping tolerable, he uses 3 mins ice at end and states lowered pain.    PATIENT EDUCATION: Education details: See above tx session for info Person educated: Patient Education method: Explanation, Demonstration, Tactile cues, Verbal cues, and Handouts Education comprehension: verbalized understanding, returned demonstration, verbal cues required, and needs further education     HOME EXERCISE PROGRAM: Access Code: [email or web address] URL: [email or web address] Prepared by: Benito Mccreedy    ASSESSMENT:   CLINICAL  IMPRESSION:  10/08/21: tolerating light PRE now at wrist and weighted stretches  10/01/21: Locking in Lt hand could hold patient back, but hopefull will resolve once wrist less tight     GOALS: Goals reviewed with patient? 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/02/76 INITIAL  3 Improve Lt grip strength to at least 70# with no pain for hard work tasks 10/30/21 INITIAL  4 Improve Lt  wrist flex and ext A/ROM to at least 50* for each for fnl wrist motion to hold objects  10/30/21 INITIAL  5 Improve impairment from 54% per Quick DASH to 15% or better for better quality of life 10/30/21 New Goal on 09/11/21    PLAN: OT FREQUENCY: 1-2x/week   OT DURATION: 8 weeks   PLANNED INTERVENTIONS: self care/ADL training, therapeutic exercise, therapeutic activity, neuromuscular re-education, manual therapy, scar mobilization, passive range of motion, splinting, ultrasound, fluidotherapy, compression bandaging, moist heat, cryotherapy, contrast bath, patient/family education, and coping strategies training   PLAN FOR NEXT SESSION:  Review HEP, keep on modalities and manual as indicated, consider taping or dynamic orthotic to progress motion.   RECOMMENDED OTHER SERVICES: none now    CONSULTED AND AGREED WITH PLAN OF CARE: Patient   Benito Mccreedy, OTR/L, CHT 10/08/2021, 4:54 PM    OCCUPATIONAL THERAPY DISCHARGE SUMMARY  Visits from Start of Care: 6  Current functional level related to goals / functional outcomes: Pt was  doing well, and was supposed to f/u for additional visits, but he did not return or call in to reschedule therapy. He is now discharged from therapy. Goals could not be formally addressed, see notes for details.    Benito Mccreedy, OTR/L, CHT 12/10/21

## 2021-10-10 ENCOUNTER — Ambulatory Visit (HOSPITAL_COMMUNITY)
Admission: EM | Admit: 2021-10-10 | Discharge: 2021-10-12 | Disposition: A | Discharge status: Other Acute Inpatient Hospital | Payer: BC Managed Care – PPO | Attending: Psychiatry | Admitting: Psychiatry

## 2021-10-10 DIAGNOSIS — F4310 Post-traumatic stress disorder, unspecified: Secondary | ICD-10-CM | POA: Insufficient documentation

## 2021-10-10 DIAGNOSIS — F209 Schizophrenia, unspecified: Secondary | ICD-10-CM | POA: Insufficient documentation

## 2021-10-10 DIAGNOSIS — F319 Bipolar disorder, unspecified: Secondary | ICD-10-CM | POA: Insufficient documentation

## 2021-10-10 DIAGNOSIS — F29 Unspecified psychosis not due to a substance or known physiological condition: Secondary | ICD-10-CM | POA: Diagnosis present

## 2021-10-10 DIAGNOSIS — Z1159 Encounter for screening for other viral diseases: Secondary | ICD-10-CM | POA: Insufficient documentation

## 2021-10-10 DIAGNOSIS — Z20822 Contact with and (suspected) exposure to covid-19: Secondary | ICD-10-CM | POA: Diagnosis not present

## 2021-10-10 LAB — CBC WITH DIFFERENTIAL/PLATELET
Abs Immature Granulocytes: 0.03 10*3/uL (ref 0.00–0.07)
Basophils Absolute: 0 10*3/uL (ref 0.0–0.1)
Basophils Relative: 0 %
Eosinophils Absolute: 0.1 10*3/uL (ref 0.0–0.5)
Eosinophils Relative: 1 %
HCT: 45.5 % (ref 39.0–52.0)
Hemoglobin: 15.3 g/dL (ref 13.0–17.0)
Immature Granulocytes: 0 %
Lymphocytes Relative: 29 %
Lymphs Abs: 2.4 10*3/uL (ref 0.7–4.0)
MCH: 28.2 pg (ref 26.0–34.0)
MCHC: 33.6 g/dL (ref 30.0–36.0)
MCV: 83.8 fL (ref 80.0–100.0)
Monocytes Absolute: 0.6 10*3/uL (ref 0.1–1.0)
Monocytes Relative: 7 %
Neutro Abs: 5.3 10*3/uL (ref 1.7–7.7)
Neutrophils Relative %: 63 %
Platelets: 396 10*3/uL (ref 150–400)
RBC: 5.43 MIL/uL (ref 4.22–5.81)
RDW: 13.6 % (ref 11.5–15.5)
WBC: 8.5 10*3/uL (ref 4.0–10.5)
nRBC: 0 % (ref 0.0–0.2)

## 2021-10-10 LAB — HEMOGLOBIN A1C
Hgb A1c MFr Bld: 5.3 % (ref 4.8–5.6)
Mean Plasma Glucose: 105.41 mg/dL

## 2021-10-10 LAB — COMPREHENSIVE METABOLIC PANEL
ALT: 19 U/L (ref 0–44)
AST: 20 U/L (ref 15–41)
Albumin: 5 g/dL (ref 3.5–5.0)
Alkaline Phosphatase: 58 U/L (ref 38–126)
Anion gap: 12 (ref 5–15)
BUN: 9 mg/dL (ref 6–20)
CO2: 26 mmol/L (ref 22–32)
Calcium: 10 mg/dL (ref 8.9–10.3)
Chloride: 99 mmol/L (ref 98–111)
Creatinine, Ser: 0.87 mg/dL (ref 0.61–1.24)
GFR, Estimated: >60 mL/min (ref >60)
Glucose, Bld: 101 mg/dL — ABNORMAL HIGH (ref 70–99)
Potassium: 4.2 mmol/L (ref 3.5–5.1)
Sodium: 137 mmol/L (ref 135–145)
Total Bilirubin: 1.4 mg/dL — ABNORMAL HIGH (ref 0.3–1.2)
Total Protein: 7.6 g/dL (ref 6.5–8.1)

## 2021-10-10 LAB — LIPID PANEL
Cholesterol: 160 mg/dL (ref 0–200)
HDL: 47 mg/dL (ref >40)
LDL Cholesterol: 103 mg/dL — ABNORMAL HIGH (ref 0–99)
Total CHOL/HDL Ratio: 3.4 RATIO
Triglycerides: 49 mg/dL (ref <150)
VLDL: 10 mg/dL (ref 0–40)

## 2021-10-10 LAB — RESP PANEL BY RT-PCR (FLU A&B, COVID) ARPGX2
Influenza A by PCR: NEGATIVE
Influenza B by PCR: NEGATIVE
SARS Coronavirus 2 by RT PCR: NEGATIVE

## 2021-10-10 LAB — TSH: TSH: 2.387 u[IU]/mL (ref 0.350–4.500)

## 2021-10-10 LAB — ETHANOL: Alcohol, Ethyl (B): <10 mg/dL (ref <10)

## 2021-10-10 LAB — POC SARS CORONAVIRUS 2 AG -  ED: SARS Coronavirus 2 Ag: NEGATIVE

## 2021-10-10 LAB — MAGNESIUM: Magnesium: 2.3 mg/dL (ref 1.7–2.4)

## 2021-10-10 MED ORDER — OLANZAPINE 10 MG PO TBDP: 10.0000 mg | ORAL_TABLET | Freq: Three times a day (TID) | ORAL | Status: DC | PRN

## 2021-10-10 MED ORDER — RISPERIDONE 1 MG PO TBDP: 1.0000 mg | ORAL_TABLET | Freq: Two times a day (BID) | ORAL | Status: AC

## 2021-10-10 MED ORDER — LORAZEPAM 1 MG PO TABS: 1.0000 mg | ORAL_TABLET | ORAL | Status: DC | PRN

## 2021-10-10 MED ORDER — HYDROXYZINE HCL 25 MG PO TABS
25.0000 mg | ORAL_TABLET | Freq: Three times a day (TID) | ORAL | Status: DC | PRN
  Administered 2021-10-10 – 2021-10-11 (×2): 25 mg via ORAL
  Filled 2021-10-10 (×3): qty 1

## 2021-10-10 MED ORDER — RISPERIDONE 1 MG PO TBDP
1.0000 mg | ORAL_TABLET | Freq: Two times a day (BID) | ORAL | Status: DC
  Administered 2021-10-10 – 2021-10-11 (×3): 1 mg via ORAL
  Filled 2021-10-10 (×3): qty 1

## 2021-10-10 MED ORDER — ZIPRASIDONE MESYLATE 20 MG IM SOLR: 20.0000 mg | INTRAMUSCULAR | Status: DC | PRN

## 2021-10-10 MED ORDER — ACETAMINOPHEN 325 MG PO TABS
650.0000 mg | ORAL_TABLET | Freq: Four times a day (QID) | ORAL | Status: DC | PRN
  Administered 2021-10-11: 650 mg via ORAL
  Filled 2021-10-10: qty 2

## 2021-10-10 MED ORDER — ALUM & MAG HYDROXIDE-SIMETH 200-200-20 MG/5ML PO SUSP: 30.0000 mL | ORAL | Status: DC | PRN

## 2021-10-10 MED ORDER — TRAZODONE HCL 50 MG PO TABS
50.0000 mg | ORAL_TABLET | Freq: Every evening | ORAL | Status: DC | PRN
  Administered 2021-10-10 – 2021-10-11 (×2): 50 mg via ORAL
  Filled 2021-10-10 (×2): qty 1

## 2021-10-10 MED ORDER — MAGNESIUM HYDROXIDE 400 MG/5ML PO SUSP: 30.0000 mL | Freq: Every day | ORAL | Status: DC | PRN

## 2021-10-10 NOTE — ED Notes (Signed)
Received message from [facility] [place], stating pt have been accepted to [facility] services to [place]. Called report to [place], [place], at [facility] [phone]. [facility] dept for transport and left VM with request for transport since pt is under IVC. Awaiting callback. ?

## 2021-10-10 NOTE — ED Notes (Addendum)
Mike West [phone], and left another VM requesting transport service to take pt to [facility]. Pt made aware. ?

## 2021-10-10 NOTE — ED Notes (Signed)
Pt lying in bed during assessment. Pt A&Ox4 with no signs of acute distress or pain at present. Pt denies SI, HI, and AVH at present. Will continue to monitor for safety.  ?

## 2021-10-10 NOTE — ED Provider Notes (Addendum)
Behavioral Health Urgent Care Medical Screening Exam ? ?Patient Name: Mike West ?MRN: [id number] ?Date of Evaluation: 10/10/21 ?Chief Complaint:   ?Diagnosis:  ?Final diagnoses:  ?Psychosis, unspecified psychosis type ([place])  ? ? ? ?HPI: patient presented to [facility] as a walk in voluntarily accompanied by [employer] with complaints of "I made some bad decisions last night". While in route spouse initiated IVC. Patient was served Teacher, English as a foreign language at [employer].  ? ?Per IVC  ?The respondent is suicidal.  The respondent is presently with please hold the gun to his head.  The respondent has been diagnosed as schizophrenia, bipolar, anxiety, and depression.  The respondent has been prescribed several medications but does not take them.  The respondent reports hearing voices that are telling him that he "is not good enough and all he does is messed up everything.  Things will be better here without him".  The respondent has a history of commitment in [place].  The respondent is a danger to himself. ? ?Mike West, 36 y.o., male patient seen face to face by this provider, consulted with Dr. Serafina Mitchell; and chart reviewed on 10/10/21.   Patient reports he has a history of schizophrenia, bipolar disorder, PTSD, GAD, panic panic disorder, substance abuse (opioid) and ADHD. Patient is from [place] and then moved to [place]. He has been in [place] since July 2022. He lives in the home with his spouse, and three children. States he has been prescribed medications such as Abilify and Prozac in the past, but does not take them because they make him feel sleepy. He has no outpatient services in place. He has a history of one inpatient psychiatric admission for substance use and "hearing voices". He denies any previous suicide attempts.  ? ?Per Laurell Roof [facility] note ? ?"Patient presents with worsening auditory hallucinations and depressive symptoms.  Patient reports he has been doing fairly well for "years" stating he "got a wife, house,  kids, great job. Life was great up until 2 months ago."  Patient shares that he began hearing voices and experiencing confusion, feeling lost.  Symptoms have worsened, especially hallucinations.  Patient is actively hallucinating during assessment, often peering around the room in response to internal stimuli. Patient states the voices have become more intrusive, more negative and much louder over the past month.  Patient reports that yesterday his phone went missing at work and whoever took it sent money via venmo to random people totaling $250. He states they traced his phone from a hotel and back to his house.  Patient states his wife didn't believe him when he told her it was stolen.  They got into an argument and patient began to experience intense/loud hallucinations.  He states they were so bad that he began having visual hallucination of demonic faces and feeling he was surrounded by darkness "that actually felt comfortable.  It was weird."  Patient states that at some point during this episode, he must have loaded his gun.  He continued to feel attacked by the voices, saying negative things like "your life is over, it's all falling apart.  You should end it."  Patient doesn't recall pulling the gun away from his head, however states he remembers cocking the gun.  He is unable to identify what stopped him from pulling the trigger.  Patient is not endorsing SI currently, however he admits the [facility] of very negative voices have continued.  He then looks up and states the voices are telling him his life is over  and that he should listen to [place] now.  Patient has had similar past episodes, most recently two yrs ago in [facility] when he was hospitalized at [place].  He has not continued medications, stating he stopped when the medicine was making him feel drowsy.  He feels he functioned fine without it until recently.  Give his acute psychosis and near attempt last night, patient is assessed to be an imminent  danger for suicide.  Inpatient is recommended.  Patient is resistant, even after discussion of his worsening symptoms, and states he needs to go home.  Discussed IVC criteria and safety concerns with patient.  He continues to request to leave". ? ?During evaluation Mike West is in sitting position. He is fairly groomed. He has a splint on his left wrist/forearm from a scate boarding injury  January 2023. He is alert/oriented x 4 and cooperative. He is speaking in a clear tone at moderate volume, and normal pace; with fair eye contact. He endorses depression with feelings of helplessness. Reports he has only slept 2 hours in the past 3 days. He endorses decreased appetite with a 10 lb weight loss over the past few months. His judgment is impaired and he is lacking insight. He is responding to internal/external stimuli. He is paranoid when talking about his relationship with his spouse. He is endorsing AVH as listed above. He is denying SI/HI at this time. Patient refused UDS and U/A. He did consent to blood work and covid testing. He refused EKG.   ? ?Collateral: Spouse Seth Bake.  States over the past few weeks she has noticed a decline in patient's condition.  Last night he put a loaded gun to his head.  States it took her 1 hour to convince him to put the gun down.  States patient eventually gave her the magazine that was inside the gun but not the actual firearm.  She was then able to get her children out of the home while the police came and escorted patient out.  States that she is unsure but believes patient may be abusing pain medications again.  States that there was $250 taken out of their account but she checked the patient's location and he was not at work where he says he was his location was at a hotel.  States patient's parents were both "drug addicts".  States patient himself began using drugs at the age of 36.  She does not feel safe with patient returning home at this time. ? ?Patient meets  criteria for inpatient psychiatric admission.  ? ? ?Psychiatric Specialty Exam ? ?Presentation  ?General Appearance:Casual; Fairly Groomed ? ?Eye Contact:Fair ? ?Speech:Clear and Coherent; Normal Rate ? ?Speech Volume:Normal ? ?Handedness:Right ? ? ?Mood and Affect  ?Mood:Anxious; Hopeless; Depressed ? ?Affect:Congruent; Depressed ? ? ?Thought Process  ?Thought Processes:Coherent ? ?Descriptions of Associations:Intact ? ?Orientation:Full (Time, Place and Person) ? ?Thought Content:Paranoid Ideation ? Diagnosis of Schizophrenia or Schizoaffective disorder in past: Yes ? Duration of Psychotic Symptoms: Less than six months ? Hallucinations:Auditory; Visual ?hears voices that tell him" i should have done it, or I dont trust you" ?sees faces of demonic type ? ?Ideas of Reference:Paranoia ? ?Suicidal Thoughts:No ? ?Homicidal Thoughts:No ? ? ?Sensorium  ?Memory:Immediate Fair; Recent Fair; Remote Fair ? ?Judgment:Impaired ? ?Insight:Poor ? ? ?Executive Functions  ?Concentration:Fair ? ?Attention Span:Fair ? ?Recall:Fair ? ?[place] ? ?Language:Good ? ? ?Psychomotor Activity  ?Psychomotor Activity:Normal ? ? ?Assets  ?Assets:Desire for Improvement; Financial Resources/Insurance; Housing; Physical Health; Resilience; Vocational/Educational ? ? ?  Sleep  ?Sleep:Poor ? ?Number of hours: 2 ? ? ?Nutritional Assessment (For OBS and [facility]BC admissions only) ?Has the patient had a weight loss or gain of 10 pounds or more in the last 3 months?: Yes ?Has the patient had a decrease in food intake/or appetite?: Yes ?Does the patient have dental problems?: No ?Does the patient have eating habits or behaviors that may be indicators of an eating disorder including binging or inducing vomiting?: No ?Has the patient recently lost weight without trying?: 1 ?Has the patient been eating poorly because of a decreased appetite?: 1 ?Malnutrition Screening Tool Score: 2 ? ? ? ?Physical Exam: ?Physical Exam ?Vitals and nursing note  reviewed.  ?Constitutional:   ?   Appearance: Normal appearance.  ?HENT:  ?   Head: Normocephalic.  ?   Right Ear: External ear normal.  ?   Left Ear: External ear normal.  ?Eyes:  ?   General:     ?   Right eye: No di

## 2021-10-10 NOTE — ED Notes (Signed)
Pt sitting on bed working crossword puzzle. No acute distress noted. Will continue to monitor for safety. ?

## 2021-10-10 NOTE — Progress Notes (Signed)
Per Julaine Fusi, patient meets criteria for inpatient treatment. There are no available or appropriate beds at [facility] today. CSW faxed referrals to the following facilities for review: ? ?[facility]  Pending - Request Sent N/A [address], [place] [address] [phone] --  ?[facility]  Pending - Request Sent N/A [address] [place] [address] [phone] --  ?[facility]  Pending - Request Sent N/A [address] [place] [address] [phone] --  ?[facility]  Pending - Request Sent N/A [address] [place] [address] [phone] --  ?[facility]  Pending - Request Sent N/A [address] [place] [address] [phone] --  ?[facility]  Pending - Request Sent N/A [address] [place], [place] [address] [phone] --  ?[facility]  Pending - Request Sent N/A [address] [place] [address] [phone] --  ?[facility]  Pending - Request Sent N/A [address] [place] [address] [phone] --  ?[facility]  Pending - Request Sent N/A [address] [place] [address] [phone] --  ?[facility]  Pending - Request Sent N/A [address] [place] [address] [phone] --  ?[facility]  Pending - Request Sent N/A [address] [place] [address] [phone] --  ?[facility]  Pending - Request Sent N/A [address] [place] [address] [phone] --  ?[facility]  Pending - Request Sent N/A [address] [place] [address] [phone] --  ?[facility]  Pending -  Request Sent N/A [address] [place] [address] [phone] --  ?[facility]  Pending - Request Sent N/A [address] [place] [address] [phone] --  ? ?TTS will continue to seek bed placement. ? ?Crissie Reese, MSW, LCSW-A, LCAS-A ?Phone: [phone] ?Disposition/TOC ? ?

## 2021-10-10 NOTE — Progress Notes (Signed)
CSW spoke with Evelena Peat with [facility] in reference to a referral sent seeking placement. An updated blood pressure was requested to further review the patient for placement.  ? ?Glennie [place], MSW, LCSW-A, LCAS-A ?Phone: [phone] ?Disposition/TOC ? ? ?

## 2021-10-10 NOTE — ED Notes (Signed)
Pt laying on bed resting with eyes closed. No acute distress noted. Safety maintained. ?

## 2021-10-10 NOTE — ED Notes (Signed)
[facility] de[facility] called and msg left [facility]. ?

## 2021-10-10 NOTE — ED Notes (Signed)
Pt admitted to observation unit due to suicide gesture by putting a gun to his head, intercepted by his wife. Wife petitioned IVC paperwork. On assessment, pt observed to be thought blocking while in interview, looking up and away as if responding to internal stimuli. Pt endorsed AH of voices saying "negative things to me" but denies intent to harm self. Pt stated, "I just got so mad with my wife, she don't say the whole story but she antagonized the situation until I just grabbed the gun. I don't remember if I put it to my head but I know I had no intention of killing myself. I just wanted her to get off my back". Pt requesting to leave but emphasized the concern for pt safety due to recent impulsive act. Oriented pt to unit and unit rules. Will monitor for safety. ?

## 2021-10-10 NOTE — Progress Notes (Signed)
Per [place], Admissions, pt has been accepted to [facility]. Accepting provider is Dr. Elby Showers. Patient can arrive anytime. Number for report is [phone]. ? ? ?Crissie Reese, MSW, LCSW-A ?Phone: [phone] ?Disposition/TOC ? ? ?

## 2021-10-10 NOTE — Discharge Instructions (Addendum)
Transfer to [facility] for inpatient psychiatric admission.  ?

## 2021-10-10 NOTE — ED Triage Notes (Signed)
Pt presents to [facility] with worsening depression symptoms. Pt states that he had a plan to kill himself lastnight with a gun and had the gun up to his head but his wife stopped him. Pt states that he has been having visual and auditory hallucinations since yesterday and states that he has had these hallucinations "for a while". Pt states that he is currently having auditory hallucinations. Pt states that he is hearing voices telling him " you should have did it" " you're going to lose your job because of this, so you should have did it". Pt reports being diagnosed with ADHD, Bipolar, Schizophrenia, PTSD, panic disorder, and anxiety. Pt states that he does not have a pyschiarist and is not prescribed any medication. Pt states that he was recently set up with an appointment at [facility] on April 4th, to start services. Pt denies HI. ?

## 2021-10-10 NOTE — ED Notes (Signed)
Received a call from Ammie Dalton and Kincaid, admissions coordinator at [facility] requesting information on pt for potential placement. Information given, then transferred Brayton Caves to Vernard Gambles, NP, for acceptance.  ?

## 2021-10-10 NOTE — ED Notes (Signed)
Pt received Risperdal M-tab and was compliant with taking medication. Continue to request to leave facility. Informed pt that he was under IVC and was unable to leave facility at this time until reassessed by provider. Safety maintained. ?

## 2021-10-10 NOTE — ED Notes (Signed)
Pt sleeping in continuous assessment. Respirations even and unlabored with no signs of acute distress. Will continue to monitor for safety.  ?

## 2021-10-10 NOTE — BH Assessment (Addendum)
Comprehensive Clinical Assessment (CCA) Note ? ?10/10/2021 ?Zadok Holaway ?[id number] ? ?Disposition: Per Vernard Gambles, NP inpatient treatment is recommended.  [facility] to review.  Disposition SW to pursue appropriate inpatient options. ? ?The patient demonstrates the following risk factors for suicide: Chronic risk factors for suicide include: psychiatric disorder of Schizoaffective Disorder, PTSD and Panic Disorder and previous suicide attempts x1 at age 36 (attempted hanging) . Acute risk factors for suicide include: family or marital conflict. Protective factors for this patient include: positive social support, responsibility to others (children, family), and hope for the future. Considering these factors, the overall suicide risk at this point appears to be high due to acute psychosis, with AH to harm self. Patient is not appropriate for outpatient follow up. ? ?Patient is a 36 year old male with a history of PTSD, Schizoaffective Disorder and Panic Disorder who presents under IVC, initiated by wife, to [facility] for assessment.  Patient presents with worsening auditory hallucinations and depressive symptoms.  Patient reports he has been doing fairly well for "years" stating he "got a wife, house, kids, great job. Life was great up until 2 months ago."  Patient shares that he began hearing voices and experiencing confusion, feeling lost.  Symptoms have worsened, especially hallucinations.  Patient is actively hallucinating during assessment, often peering around the room in response to internal stimuli. Patient states the voices have become more intrusive, more negative and much louder over the past month.  Patient reports that yesterday his phone went missing at work and whoever took it sent money via venmo to random people totaling $250. He states they traced his phone from a hotel and back to his house.  Patient states his wife didn't believe him when he told her it was stolen.  They got  into an argument and patient began to experience intense/loud hallucinations.  He states they were so bad that he began having visual hallucination of demonic faces and feeling he was surrounded by darkness "that actually felt comfortable.  It was weird."  Patient states that at some point during this episode, he must have loaded his gun.  He continued to feel attacked by the voices, saying negative things like "your life is over, it's all falling apart.  You should end it."  Patient doesn't recall pulling the gun away from his head, however states he remembers cocking the gun.  He is unable to identify what stopped him from pulling the trigger.  Patient is not endorsing SI currently, however he admits the [facility] of very negative voices have continued.  He then looks up and states the voices are telling him his life is over and that he should listen to [place] now.  Patient has had similar past episodes, most recently two yrs ago in [facility] when he was hospitalized [facility].  He has not continued medications, stating he stopped when the medicine was making him feel drowsy.  He feels he functioned fine without it until recently.  Give his acute psychosis and near attempt last night, patient is assessed to be an imminent danger for suicide.  Inpatient is recommended.  Patient is resistant, even after discussion of his worsening symptoms, and states he needs to go home.  Discussed IVC criteria and safety concerns with patient.  He continues to request to leave.   ? ?Vernard Gambles, NP spoke with patient's wife/petitioner.  See her note for collateral information.   ? ?Chief Complaint:  ?Chief Complaint  ?Patient presents with  ?  Suicidal  ? ?Visit Diagnosis: Schizoaffective Disorder ?                            PTSD ?                            Panic Disorder  ?Flowsheet Row ED from 10/10/2021 in [facility]  ?Thoughts that you would be better off dead, or of hurting yourself in some way  More than half the days  ?PHQ-9 Total Score 18  ? ?  ? ?Flowsheet Row ED from 10/10/2021 in [facility] (Discharged) from 08/24/2021 in  [facility] ED from 08/18/2021 in [facility]  ?C-SSRS RISK CATEGORY High Risk No Risk No Risk  ? ?  ? ? ?CCA Screening, Triage and Referral (STR) ? ?Patient Reported Information ?How did you hear about [place]? Legal System ? ?What Is the Reason for Your Visit/Call Today? Pt presents to [facility] with worsening depression symptoms. Pt states that he had a plan to kill himself lastnight with a gun and had the gun up to his head but his wife stopped him. Pt states that he has been having visual and auditory hallucinations since yesterday and states that he has had these hallucinations "for a while". Pt states that he is currently having auditory hallucinations. Pt states that he is hearing voices telling him " you should have did it" " you're going to lose your job because of this, so you should have did it". Pt reports being diagnosed with ADHD, Bipolar, Schizophrenia, PTSD, panic disorder, and anxiety. Pt states that he does not have a pyschiarist and is not prescribed any medication. Pt states that he was recently set up with an appointment at [facility] on April 4th, to start services. Pt denies HI. ? ?How Long Has This Been Causing You Problems? <Week ? ?What Do You Feel Would Help You the Most Today? Treatment for Depression or other mood problem ? ? ?Have You Recently Had Any Thoughts About Hurting Yourself? Yes ? ?Are You Planning to Commit Suicide/Harm Yourself At This time? Yes (with a gun) ? ? ?Have you Recently Had Thoughts About Hurting Someone Karolee Ohs? No ? ?Are You Planning to Harm Someone at This Time? No ? ?Explanation: No data recorded ? ?Have You Used Any Alcohol or Drugs in the Past 24 Hours? No ? ?How Long Ago Did You Use Drugs or Alcohol? No data recorded ?What Did You Use and How Much? No  data recorded ? ?Do You Currently Have a Therapist/Psychiatrist? No ? ?Name of Therapist/Psychiatrist: No data recorded ? ?Have You Been Recently Discharged From Any Office Practice or Programs? No ? ?Explanation of Discharge From Practice/Program: No data recorded ? ?  ?CCA Screening Triage Referral Assessment ?Type of Contact: Face-to-Face ? ?Telemedicine Service Delivery:   ?Is this Initial or Reassessment? No data recorded ?Date Telepsych consult ordered in [facility]:  No data recorded ?Time Telepsych consult ordered in [facility]:  No data recorded ?Location of Assessment: [facility] ? ?Provider Location: [facility] Assessment Services ? ? ?Collateral Involvement: Provider is attempting to reach patient's wife. ? ? ?Does Patient Have a Automotive engineer Guardian? No data recorded ?Name and Contact of Legal Guardian: No data recorded ?If Minor and Not Living with Parent(s), Who has Custody? No data recorded ?Is CPS involved or ever  been involved? Never ? ?Is APS involved or ever been involved? Never ? ? ?Patient Determined To Be At Risk for Harm To Self or Others Based on Review of Patient Reported Information or Presenting Complaint? Yes, for Self-Harm ? ?Method: No data recorded ?Availability of Means: No data recorded ?Intent: No data recorded ?Notification Required: No data recorded ?Additional Information for Danger to Others Potential: No data recorded ?Additional Comments for Danger to Others Potential: No data recorded ?Are There Guns or Other Weapons in Your Home? No data recorded ?Types of Guns/Weapons: No data recorded ?Are These Weapons Safely Secured?                            No data recorded ?Who Could Verify You Are Able To Have These Secured: No data recorded ?Do You Have any Outstanding Charges, Pending Court Dates, Parole/Probation? No data recorded ?Contacted To Inform of Risk of Harm To Self or Others: Family/Significant Other:; Law Enforcement ? ? ? ?Does Patient Present under  Involuntary Commitment? No (wife may be in process of filing IVC) ? ?IVC Papers Initial File Date: No data recorded ? [place] ? ? ?Patient Currently Receiving the Following Services: Not Recei

## 2021-10-10 NOTE — ED Notes (Signed)
Dinner was given 

## 2021-10-11 LAB — POCT URINE DRUG SCREEN - MANUAL ENTRY (I-SCREEN)
POC Amphetamine UR: POSITIVE — AB
POC Buprenorphine (BUP): NOT DETECTED
POC Cocaine UR: NOT DETECTED
POC Marijuana UR: POSITIVE — AB
POC Methadone UR: NOT DETECTED
POC Methamphetamine UR: POSITIVE — AB
POC Morphine: NOT DETECTED
POC Oxazepam (BZO): NOT DETECTED
POC Oxycodone UR: NOT DETECTED
POC Secobarbital (BAR): NOT DETECTED

## 2021-10-11 LAB — RPR: RPR Ser Ql: NONREACTIVE

## 2021-10-11 LAB — URINALYSIS, COMPLETE (UACMP) WITH MICROSCOPIC
Bacteria, UA: NONE SEEN
Bilirubin Urine: NEGATIVE
Glucose, UA: NEGATIVE mg/dL
Hgb urine dipstick: NEGATIVE
Ketones, ur: NEGATIVE mg/dL
Nitrite: NEGATIVE
Protein, ur: NEGATIVE mg/dL
Specific Gravity, Urine: 1.014 (ref 1.005–1.030)
pH: 7 (ref 5.0–8.0)

## 2021-10-11 MED ORDER — RISPERIDONE 1 MG PO TBDP
1.0000 mg | ORAL_TABLET | Freq: Two times a day (BID) | ORAL | Status: DC
  Administered 2021-10-11: 1 mg via ORAL
  Filled 2021-10-11: qty 1

## 2021-10-11 MED ORDER — BENZTROPINE MESYLATE 1 MG PO TABS
1.0000 mg | ORAL_TABLET | Freq: Two times a day (BID) | ORAL | Status: DC
  Administered 2021-10-11 (×2): 1 mg via ORAL
  Filled 2021-10-11 (×3): qty 1

## 2021-10-11 MED ORDER — RISPERIDONE 0.5 MG PO TBDP: 0.5000 mg | ORAL_TABLET | Freq: Two times a day (BID) | ORAL | Status: DC

## 2021-10-11 NOTE — ED Provider Notes (Signed)
Mike West was seen and evaluated face-to-face.  Patient presents under involuntary commitment petition by his wife due to suicidal ideations with plan and intent.  [facility], it was reported that they have now asked for patient to arrive 10/12/2021 due to computer system issues.  ? ?NP followed up with behavioral health for possible acceptance.  Upheld IVC.  First examination was completed by this NP. ? ?Patient continues to meet inpatient criteria ?

## 2021-10-11 NOTE — ED Notes (Signed)
Called report to [facility] this morning. After getting off the phone with report, received a secure chat stating that [facility] was unable to receive the Pt today d/t their systems were down. Left voicemail for the [facility] to cancel transportation to [facility].  ?

## 2021-10-11 NOTE — Progress Notes (Signed)
CSW received call from [place] with [facility], who advised that [facility] are currently down for admissions and want to delay this patient's admission until tomorrow after 9:00am.  ? ?Crissie Reese, MSW, LCSW-A, LCAS-A ?Phone: [phone] ?Disposition/TOC ? ?

## 2021-10-11 NOTE — ED Notes (Signed)
Pt asleep in bed. Respirations even and unlabored. Will continue to monitor for safety. ?

## 2021-10-11 NOTE — ED Notes (Signed)
Patient complained of having increased anxiety and was given Vistaril 25 mg and his scheduled dose of Cogentin.  ?

## 2021-10-11 NOTE — ED Notes (Signed)
Pt awake, up and walking around observation unit. Requested to see the provider. This nurse informed the Pt that the providers will be around soon. Safety maintained and will continue to monitor.  ?

## 2021-10-11 NOTE — ED Notes (Signed)
Pt is currently sleeping, no distress noted, environmental check complete, will continue to monitor patient for safety. ? ?

## 2021-10-11 NOTE — ED Notes (Signed)
Patient resting with no sxs of distress - will continue to monitor for safety 

## 2021-10-12 LAB — GC/CHLAMYDIA PROBE AMP (~~LOC~~) NOT AT ARMC
Chlamydia: NEGATIVE
Comment: NEGATIVE
Comment: NORMAL
Neisseria Gonorrhea: NEGATIVE

## 2021-10-12 NOTE — ED Notes (Signed)
Pt resting quietly. Breathing even and unlabored.   Pt in view of Nursing station.  No distress noted at this time.   Care assumed. And will monitor for safety.  ?

## 2021-10-12 NOTE — ED Notes (Signed)
Report was called to Lobbyist at [facility].  Verbalized understanding.  Pt Denies SI, HI or avh at time of transfer.  Pt's belongings were given to [facility]. For transfer.  No distress noted.  ?

## 2021-10-12 NOTE — ED Notes (Signed)
Pt is currently sleeping, no distress noted, environmental check complete, will continue to monitor patient for safety. ? ?

## 2021-10-12 NOTE — ED Notes (Signed)
Pt was given breakfast  ?

## 2021-10-13 ENCOUNTER — Encounter: Payer: BC Managed Care – PPO | Admitting: Rehabilitative and Restorative Service Providers"

## 2021-10-13 ENCOUNTER — Telehealth: Payer: Self-pay | Admitting: Rehabilitative and Restorative Service Providers"

## 2021-10-13 NOTE — Telephone Encounter (Signed)
Left a message for him due to 1st missed appointment. Left details about next Wed 10:15 apt. Gave callback # if needed.  ?

## 2021-10-13 NOTE — Therapy (Incomplete)
?OUTPATIENT OCCUPATIONAL THERAPY TREATMENT NOTE ? ? ?Patient Name: Mike West ?MRN: [id number] ?DOB:07/19/1986, 36 y.o., male ?Today's Date: 10/13/2021 ? ?PCP: Michael Boston, MD ?REFERRING PROVIDER: Sherilyn Cooter, MD ? ? ? ? ? ? ? ? ?Past Medical History:  ?Diagnosis Date  ? ADHD (attention deficit hyperactivity disorder)   ? GERD (gastroesophageal reflux disease)   ? History of kidney stones   ? Pasted  ? ?Past Surgical History:  ?Procedure Laterality Date  ? OPEN REDUCTION INTERNAL FIXATION (ORIF) DISTAL RADIAL FRACTURE Left 08/24/2021  ? Procedure: Left OPEN REDUCTION INTERNAL FIXATION (ORIF) DISTAL RADIAL FRACTURE;  Surgeon: Sherilyn Cooter, MD;  Location: [facility];  Service: Orthopedics;  Laterality: Left;  ? SHOULDER SURGERY Left   ? VASECTOMY    ? ?Patient Active Problem List  ? Diagnosis Date Noted  ? Closed fracture of left distal radius 08/20/2021  ? ? ?ONSET DATE: 08/18/21 DOI, 08/24/21 DOS  ?  ?REFERRING DIAG: O35.009F (ICD-10-CM) - Other closed intra-articular fracture of distal end of left radius, initial encounter ? ?THERAPY DIAG:  ?No diagnosis found. ? ? ?PERTINENT HISTORY: ~7 weeks postop now. Distal radius fracture s/p ORIF 08/24/21  ? ?PRECAUTIONS:wrist protection brace until 5-6 weeks, no strengthening until 6-8 weeks as tol  ? 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 He states going to gym and testing light wrist weight bearing.  ? ?PAIN:  ?Are you having pain? *** ?NPRS scale: 5/10 at rest now ?Pain location: diffuse about Lt wrist  ?Pain orientation: Left  ?PAIN TYPE: stabbing, throbbing ?Pain description: intermittent  ?Aggravating factors: attempted use ?Relieving factors: ice, rest, etc.  ? ? ?OBJECTIVE: (All Objective from eval on 09/04/21, unless otherwise specified) ? ?DIAGNOSTIC FINDINGS: N/A ? ?ADLs: ?Overall ADLs: Pt unable to perform basic tasks with sore and stiff lt hand now ?  ?FUNCTIONAL OUTCOME  MEASURES: ?09/11/21: Katina Dung 54.5% functional impairment (issues with sleep, utensils, opening containers, leisure, etc.)  ?  ?  ?SENSATION: ?Light touch: Appears intact ?Stereognosis: Appears intact ?Hot/Cold: Appears intact ?Proprioception: Appears intact ?  ?  ?COORDINATION: ?  ?Box and Blocks:44 blocks 09/18/21 ?Comments: ?  ?EDEMA: mild swelling about the Lt wrist, given compression tubular dressing 09/11/22 to help  ?  ?MUSCLE TONE: inhibited and weak in Lt wrist/hand, some muscle wasting in Lt FA  ?  ?SKIN INTEGRITY: sx areas just  recently closed/healed, will avoid scar massage for 1 more week.  ?  ?PALPATION: TTP to Lt wrist as typical for condition ?  ?UE AROM/PROM: ?  ?A/PROM Left ?09/04/2021 Lt  ?09/18/21 ? Lt  ?09/25/21  ?Wrist flexion 0 16 20  ?Wrist extension 15 40 40  ?Wrist ulnar deviation      ?Wrist radial deviation      ?Wrist pronation 75    ?Wrist supination 30    ?(Blank rows = not tested) ?  ?HAND A/PROM: 09/18/21: now can make full fist  ?  ?  ?UE MMT: NOT TESTED at EVAL  ?  ?  ?HAND FUNCTION: ?  ?Grip strength: Right: 97# lbs; Left: NT lbs ?Comments: Will perform Lt hand when appropriate  ?10/01/21: 17.7# no pain ?  ?TODAY'S TREATMENT:  ?10/13/21: *** ? ?10/08/21: He starts with A/ROM in fluido for 5 mins there ex, then OT does manual therapy to FA, wrist, hand trying to push more into PROM, using joint mobs at wrist in flex, ext to try to help  gain motion. OT also edu on suing weighted wrist stretches now in wrist ext, flexion and forearm pro and supination.  He tolerates fairly well with light 1-2# weight. He also performs concentric exercises in these motions as well. OT also edu for finger ext with putty and he states understanding. Pt somewhat sore at end from harder stretches, advised to use ice at home, rest when feeling "overworked" at home or work.  ? ?10/01/21: Pt reaches and grasps for small objects in fluido machine 5 mins. OT performs manual therapy traction, stretches, joint oscillations  for FA, wrist in all planes today. He states feeling looser. Next he performs pegs activity to promote wrist flexion and supination. He was given theraputty to strengthen pinch and grasp, but unfortunately had MF locking at A1 pulley. He states never experiencing this before. Grasp was downgraded to isometric wide grip which again was tolerated well. He was edu to avoid tight closed fist to prevent locking and do wrist ext stretches with fingers held back as well. He states understanding.  ? ?09/25/21: Pt warms up in fluidotherapy for 5 mins concurrently performing A/ROM HEP review for flex, ext, deviations to tolerance. He feels looser/better at end. OT performs manual therapy with IASTM that he tolerates well and attempt  to increase wrist flexion more. Next, he starts light isometric gripping 5x 5-10sec 1-2 x day as tolerated, he has some pain with that, is encouraged to stop ASAP if sharply painful. While OT reviews HEP and keeping stretches and gripping tolerable, he uses 3 mins ice at end and states lowered pain.  ?  ?PATIENT EDUCATION: ?Education details: See above tx session for info ?Person educated: Patient ?Education method: Explanation, Demonstration, Tactile cues, Verbal cues, and Handouts ?Education comprehension: verbalized understanding, returned demonstration, verbal cues required, and needs further education ?  ?  ?HOME EXERCISE PROGRAM: ?Access Code: [email or web address] ?URL: [email or web address] ?Prepared by: Benito Mccreedy ?   ?ASSESSMENT: ?  ?CLINICAL IMPRESSION: ? 10/13/21: *** ? ?10/08/21: tolerating light PRE now at wrist and weighted stretches ? ?10/01/21: Locking in Lt hand could hold patient back, but hopefull will resolve once wrist less tight ? ? ?  ?GOALS: ?Goals reviewed with patient? Yes ?  ?SHORT TERM GOALS: (STG required if POC>30 days) ?  ?STG Name Target Date Goal status  ?1 Obtain protective orthotic  09/04/21 MET  ?2 Demo back initial HEP for A/ROM without pain or issues  09/18/21  09/18/21: partially met- is able to demo but has some pain still  ?  ?LONG TERM GOALS:  ?  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  ?PLAN: ?OT FREQUENCY: 1-2x/week ?  ?OT DURATION: 8 weeks ?  ?PLANNED INTERVENTIONS: self care/ADL training, therapeutic exercise, therapeutic activity, neuromuscular re-education, manual therapy, scar mobilization, passive range of motion, splinting, ultrasound, fluidotherapy, compression bandaging, moist heat, cryotherapy, contrast bath, patient/family education, and coping  strategies training ?  ?PLAN FOR NEXT SESSION:  ?*** ? ?Review HEP, keep on modalities and manual as indicated, consider taping or dynamic orthotic to progress motion.  ? ?RECOMMENDED OTHER SERVICES: none now  ?  ?CONSULTED AND AGREED WITH PLAN OF CARE: Patient ? ? ?Benito Mccreedy, OTR/L, CHT ?10/13/2021, 7:47 AM ? ?  ? ?  ?

## 2021-10-21 ENCOUNTER — Telehealth: Payer: Self-pay | Admitting: Rehabilitative and Restorative Service Providers"

## 2021-10-21 ENCOUNTER — Encounter: Payer: BC Managed Care – PPO | Admitting: Rehabilitative and Restorative Service Providers"

## 2021-10-21 NOTE — Telephone Encounter (Signed)
OT called pt's spouse to discuss 2nd missed appointment. OT left message requesting a call back about his status as it seems he may be indisposed for the time being, and may not be able to continue therapy, unfortunately.  ?

## 2021-10-21 NOTE — Telephone Encounter (Signed)
Pt called to speak with you. I told him you were with a pt and you would call him afterwards.  ? ?CB [phone] ?

## 2021-10-21 NOTE — Therapy (Incomplete)
?OUTPATIENT OCCUPATIONAL THERAPY TREATMENT NOTE ? ? ?Patient Name: Mike West ?MRN: [id number] ?DOB:02-Oct-1985, 36 y.o., male ?Today's Date: 10/21/2021 ? ?PCP: Michael Boston, MD ?REFERRING PROVIDER: Sherilyn Cooter, MD ? ? ? ? ? ? ? ? ?Past Medical History:  ?Diagnosis Date  ? ADHD (attention deficit hyperactivity disorder)   ? GERD (gastroesophageal reflux disease)   ? History of kidney stones   ? Pasted  ? ?Past Surgical History:  ?Procedure Laterality Date  ? OPEN REDUCTION INTERNAL FIXATION (ORIF) DISTAL RADIAL FRACTURE Left 08/24/2021  ? Procedure: Left OPEN REDUCTION INTERNAL FIXATION (ORIF) DISTAL RADIAL FRACTURE;  Surgeon: Sherilyn Cooter, MD;  Location: [facility];  Service: Orthopedics;  Laterality: Left;  ? SHOULDER SURGERY Left   ? VASECTOMY    ? ?Patient Active Problem List  ? Diagnosis Date Noted  ? Closed fracture of left distal radius 08/20/2021  ? ? ?ONSET DATE: 08/18/21 DOI, 08/24/21 DOS  ?  ?REFERRING DIAG: I77.824M (ICD-10-CM) - Other closed intra-articular fracture of distal end of left radius, initial encounter ? ?THERAPY DIAG:  ?No diagnosis found. ? ? ?PERTINENT HISTORY: 8 weeks postop now. Distal radius fracture s/p ORIF 08/24/21  ? ?PRECAUTIONS: wrist protection brace until 5-6 weeks, no strengthening until 6-8 weeks as tol  ? 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 He states going to gym and testing light wrist weight bearing.  ? ?PAIN:  ?Are you having pain? *** ?NPRS scale: 5/10 at rest now ?Pain location: diffuse about Lt wrist  ?Pain orientation: Left  ?PAIN TYPE: stabbing, throbbing ?Pain description: intermittent  ?Aggravating factors: attempted use ?Relieving factors: ice, rest, etc.  ? ? ?OBJECTIVE: (All Objective from eval on 09/04/21, unless otherwise specified) ? ?DIAGNOSTIC FINDINGS: N/A ? ?ADLs: ?Overall ADLs: Pt unable to perform basic tasks with sore and stiff lt hand now ?  ?FUNCTIONAL OUTCOME  MEASURES: ?09/11/21: Katina Dung 54.5% functional impairment (issues with sleep, utensils, opening containers, leisure, etc.)  ?  ?  ?SENSATION: ?Light touch: Appears intact ?Stereognosis: Appears intact ?Hot/Cold: Appears intact ?Proprioception: Appears intact ?  ?  ?COORDINATION: ?  ?Box and Blocks:44 blocks 09/18/21 ?Comments: ?  ?EDEMA: mild swelling about the Lt wrist, given compression tubular dressing 09/11/22 to help  ?  ?MUSCLE TONE: inhibited and weak in Lt wrist/hand, some muscle wasting in Lt FA  ?  ?SKIN INTEGRITY: sx areas just  recently closed/healed, will avoid scar massage for 1 more week.  ?  ?PALPATION: TTP to Lt wrist as typical for condition ?  ?UE AROM/PROM: ?  ?A/PROM Left ?09/04/2021 Lt  ?09/18/21 ? Lt  ?09/25/21  ?Wrist flexion 0 16 20  ?Wrist extension 15 40 40  ?Wrist ulnar deviation      ?Wrist radial deviation      ?Wrist pronation 75    ?Wrist supination 30    ?(Blank rows = not tested) ?  ?HAND A/PROM: 09/18/21: now can make full fist  ?  ?  ?UE MMT: NOT TESTED at EVAL  ?  ?  ?HAND FUNCTION: ?  ?Grip strength: Right: 97# lbs; Left: NT lbs ?Comments: Will perform Lt hand when appropriate  ?10/01/21: 17.7# no pain ?  ?TODAY'S TREATMENT:  ?10/21/21: *** ? ?10/08/21: He starts with A/ROM in fluido for 5 mins there ex, then OT does manual therapy to FA, wrist, hand trying to push more into PROM, using joint mobs at wrist in flex, ext to try to  help gain motion. OT also edu on suing weighted wrist stretches now in wrist ext, flexion and forearm pro and supination.  He tolerates fairly well with light 1-2# weight. He also performs concentric exercises in these motions as well. OT also edu for finger ext with putty and he states understanding. Pt somewhat sore at end from harder stretches, advised to use ice at home, rest when feeling "overworked" at home or work.  ? ?10/01/21: Pt reaches and grasps for small objects in fluido machine 5 mins. OT performs manual therapy traction, stretches, joint oscillations  for FA, wrist in all planes today. He states feeling looser. Next he performs pegs activity to promote wrist flexion and supination. He was given theraputty to strengthen pinch and grasp, but unfortunately had MF locking at A1 pulley. He states never experiencing this before. Grasp was downgraded to isometric wide grip which again was tolerated well. He was edu to avoid tight closed fist to prevent locking and do wrist ext stretches with fingers held back as well. He states understanding.  ? ?09/25/21: Pt warms up in fluidotherapy for 5 mins concurrently performing A/ROM HEP review for flex, ext, deviations to tolerance. He feels looser/better at end. OT performs manual therapy with IASTM that he tolerates well and attempt  to increase wrist flexion more. Next, he starts light isometric gripping 5x 5-10sec 1-2 x day as tolerated, he has some pain with that, is encouraged to stop ASAP if sharply painful. While OT reviews HEP and keeping stretches and gripping tolerable, he uses 3 mins ice at end and states lowered pain.  ?  ?PATIENT EDUCATION: ?Education details: See above tx session for info ?Person educated: Patient ?Education method: Explanation, Demonstration, Tactile cues, Verbal cues, and Handouts ?Education comprehension: verbalized understanding, returned demonstration, verbal cues required, and needs further education ?  ?  ?HOME EXERCISE PROGRAM: ?Access Code: [email or web address] ?URL: [email or web address] ?Prepared by: Benito Mccreedy ?   ?ASSESSMENT: ?  ?CLINICAL IMPRESSION: ? 10/21/21: *** ? ?10/08/21: tolerating light PRE now at wrist and weighted stretches ? ?10/01/21: Locking in Lt hand could hold patient back, but hopefull will resolve once wrist less tight ? ? ?  ?GOALS: ?Goals reviewed with patient? Yes ?  ?SHORT TERM GOALS: (STG required if POC>30 days) ?  ?STG Name Target Date Goal status  ?1 Obtain protective orthotic  09/04/21 MET  ?2 Demo back initial HEP for A/ROM without pain or issues  09/18/21  09/18/21: partially met- is able to demo but has some pain still  ?  ?LONG TERM GOALS:  ?  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  ?PLAN: ?OT FREQUENCY: 1-2x/week ?  ?OT DURATION: 8 weeks ?  ?PLANNED INTERVENTIONS: self care/ADL training, therapeutic exercise, therapeutic activity, neuromuscular re-education, manual therapy, scar mobilization, passive range of motion, splinting, ultrasound, fluidotherapy, compression bandaging, moist heat, cryotherapy, contrast bath, patient/family education, and  coping strategies training ?  ?PLAN FOR NEXT SESSION:  ?*** ? ?Review HEP, keep on modalities and manual as indicated, consider taping or dynamic orthotic to progress motion.  ? ?RECOMMENDED OTHER SERVICES: none now  ?  ?CONSULTED AND AGREED WITH PLAN OF CARE: Patient ? ? ?Benito Mccreedy, OTR/L, CHT ?10/21/2021, 7:52 AM ? ?  ? ?  ?

## 2021-10-29 ENCOUNTER — Encounter: Payer: BC Managed Care – PPO | Admitting: Rehabilitative and Restorative Service Providers"

## 2021-10-29 ENCOUNTER — Ambulatory Visit: Payer: BC Managed Care – PPO | Admitting: Orthopedic Surgery

## 2021-10-29 NOTE — Therapy (Incomplete)
?OUTPATIENT OCCUPATIONAL THERAPY TREATMENT NOTE ? ? ?Patient Name: Mike West ?MRN: [id number] ?DOB:1986/04/18, 36 y.o., male ?Today's Date: 10/29/2021 ? ?PCP: Michael Boston, MD ?REFERRING PROVIDER: Sherilyn Cooter, MD ? ? ? ? ? ? ? ? ?Past Medical History:  ?Diagnosis Date  ? ADHD (attention deficit hyperactivity disorder)   ? GERD (gastroesophageal reflux disease)   ? History of kidney stones   ? Pasted  ? ?Past Surgical History:  ?Procedure Laterality Date  ? OPEN REDUCTION INTERNAL FIXATION (ORIF) DISTAL RADIAL FRACTURE Left 08/24/2021  ? Procedure: Left OPEN REDUCTION INTERNAL FIXATION (ORIF) DISTAL RADIAL FRACTURE;  Surgeon: Sherilyn Cooter, MD;  Location: [facility];  Service: Orthopedics;  Laterality: Left;  ? SHOULDER SURGERY Left   ? VASECTOMY    ? ?Patient Active Problem List  ? Diagnosis Date Noted  ? Closed fracture of left distal radius 08/20/2021  ? ? ?ONSET DATE: 08/18/21 DOI, 08/24/21 DOS  ?  ?REFERRING DIAG: Q94.765Y (ICD-10-CM) - Other closed intra-articular fracture of distal end of left radius, initial encounter ? ?THERAPY DIAG:  ?No diagnosis found. ? ? ?PERTINENT HISTORY: 9+ weeks postop now. Distal radius fracture s/p ORIF 08/24/21  ? ?PRECAUTIONS: wrist protection brace until 5-6 weeks, no strengthening until 6-8 weeks as tol  ? 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 He states going to gym and testing light wrist weight bearing.  ? ?PAIN:  ?Are you having pain? *** ?NPRS scale: ***/10 at rest now ?Pain location: diffuse about Lt wrist  ?Pain orientation: Left  ?PAIN TYPE: stabbing, throbbing ?Pain description: intermittent  ?Aggravating factors: attempted use ?Relieving factors: ice, rest, etc.  ? ? ?OBJECTIVE: (All Objective from eval on 09/04/21, unless otherwise specified) ? ?DIAGNOSTIC FINDINGS: N/A ? ?ADLs: ?Overall ADLs: Pt unable to perform basic tasks with sore and stiff lt hand now ?  ?FUNCTIONAL OUTCOME  MEASURES: ?09/11/21: Katina Dung 54.5% functional impairment (issues with sleep, utensils, opening containers, leisure, etc.)  ?  ?  ?SENSATION: ?Light touch: Appears intact ?Stereognosis: Appears intact ?Hot/Cold: Appears intact ?Proprioception: Appears intact ?  ?  ?COORDINATION: ?  ?Box and Blocks:44 blocks 09/18/21 ?Comments: ?  ?EDEMA: mild swelling about the Lt wrist, given compression tubular dressing 09/11/22 to help  ?  ?MUSCLE TONE: inhibited and weak in Lt wrist/hand, some muscle wasting in Lt FA  ?  ?SKIN INTEGRITY: sx areas just  recently closed/healed, will avoid scar massage for 1 more week.  ?  ?PALPATION: TTP to Lt wrist as typical for condition ?  ?UE AROM/PROM: ?  ?A/PROM Left ?09/04/2021 Lt  ?09/18/21 ? Lt  ?09/25/21  ?Wrist flexion 0 16 20  ?Wrist extension 15 40 40  ?Wrist ulnar deviation      ?Wrist radial deviation      ?Wrist pronation 75    ?Wrist supination 30    ?(Blank rows = not tested) ?  ?HAND A/PROM: 09/18/21: now can make full fist  ?  ?  ?UE MMT: NOT TESTED at EVAL  ?  ?  ?HAND FUNCTION: ?  ?Grip strength: Right: 97# lbs; Left: NT lbs ?Comments: Will perform Lt hand when appropriate  ?10/01/21: 17.7# no pain ?  ?TODAY'S TREATMENT:  ?10/29/21: *** ? ?10/08/21: He starts with A/ROM in fluido for 5 mins there ex, then OT does manual therapy to FA, wrist, hand trying to push more into PROM, using joint mobs at wrist in flex, ext to try to  help gain motion. OT also edu on suing weighted wrist stretches now in wrist ext, flexion and forearm pro and supination.  He tolerates fairly well with light 1-2# weight. He also performs concentric exercises in these motions as well. OT also edu for finger ext with putty and he states understanding. Pt somewhat sore at end from harder stretches, advised to use ice at home, rest when feeling "overworked" at home or work.  ? ?10/01/21: Pt reaches and grasps for small objects in fluido machine 5 mins. OT performs manual therapy traction, stretches, joint oscillations  for FA, wrist in all planes today. He states feeling looser. Next he performs pegs activity to promote wrist flexion and supination. He was given theraputty to strengthen pinch and grasp, but unfortunately had MF locking at A1 pulley. He states never experiencing this before. Grasp was downgraded to isometric wide grip which again was tolerated well. He was edu to avoid tight closed fist to prevent locking and do wrist ext stretches with fingers held back as well. He states understanding.  ? ?09/25/21: Pt warms up in fluidotherapy for 5 mins concurrently performing A/ROM HEP review for flex, ext, deviations to tolerance. He feels looser/better at end. OT performs manual therapy with IASTM that he tolerates well and attempt  to increase wrist flexion more. Next, he starts light isometric gripping 5x 5-10sec 1-2 x day as tolerated, he has some pain with that, is encouraged to stop ASAP if sharply painful. While OT reviews HEP and keeping stretches and gripping tolerable, he uses 3 mins ice at end and states lowered pain.  ?  ?PATIENT EDUCATION: ?Education details: See above tx session for info ?Person educated: Patient ?Education method: Explanation, Demonstration, Tactile cues, Verbal cues, and Handouts ?Education comprehension: verbalized understanding, returned demonstration, verbal cues required, and needs further education ?  ?  ?HOME EXERCISE PROGRAM: ?Access Code: [email or web address] ?URL: [email or web address] ?Prepared by: Benito Mccreedy ?   ?ASSESSMENT: ?  ?CLINICAL IMPRESSION: ? 10/29/21: *** ? ?10/08/21: tolerating light PRE now at wrist and weighted stretches ? ?10/01/21: Locking in Lt hand could hold patient back, but hopefull will resolve once wrist less tight ? ? ?  ?GOALS: ?Goals reviewed with patient? Yes ?  ?SHORT TERM GOALS: (STG required if POC>30 days) ?  ?STG Name Target Date Goal status  ?1 Obtain protective orthotic  09/04/21 MET  ?2 Demo back initial HEP for A/ROM without pain or issues  09/18/21  09/18/21: partially met- is able to demo but has some pain still  ?  ?LONG TERM GOALS:  ?  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  ?PLAN: ?OT FREQUENCY: 1-2x/week ?  ?OT DURATION: 8 weeks ?  ?PLANNED INTERVENTIONS: self care/ADL training, therapeutic exercise, therapeutic activity, neuromuscular re-education, manual therapy, scar mobilization, passive range of motion, splinting, ultrasound, fluidotherapy, compression bandaging, moist heat, cryotherapy, contrast bath, patient/family education, and  coping strategies training ?  ?PLAN FOR NEXT SESSION:  ?*** ? ?Review HEP, keep on modalities and manual as indicated, consider taping or dynamic orthotic to progress motion.  ? ?RECOMMENDED OTHER SERVICES: none now  ?  ?CONSULTED AND AGREED WITH PLAN OF CARE: Patient ? ? ?Benito Mccreedy, OTR/L, CHT ?10/29/2021, 7:59 AM ? ?  ? ?  ?

## 2021-11-09 ENCOUNTER — Telehealth (HOSPITAL_COMMUNITY): Payer: Self-pay | Admitting: Internal Medicine

## 2021-11-09 NOTE — BH Assessment (Signed)
Care Management - Follow Up [facility] Discharges  ? ?Patient has been placed in an inpatient psychiatric hospital ([facility]) on 10-10-2021. ?

## 2022-04-06 ENCOUNTER — Encounter (HOSPITAL_COMMUNITY): Payer: Self-pay | Admitting: Emergency Medicine

## 2022-04-06 ENCOUNTER — Emergency Department (HOSPITAL_COMMUNITY)
Admission: EM | Admit: 2022-04-06 | Discharge: 2022-04-06 | Discharge status: Against Medical Advice | Payer: BC Managed Care – PPO | Attending: Emergency Medicine | Admitting: Emergency Medicine

## 2022-04-06 ENCOUNTER — Other Ambulatory Visit: Payer: Self-pay

## 2022-04-06 DIAGNOSIS — Z0000 Encounter for general adult medical examination without abnormal findings: Secondary | ICD-10-CM | POA: Diagnosis present

## 2022-04-06 DIAGNOSIS — Z5321 Procedure and treatment not carried out due to patient leaving prior to being seen by health care provider: Secondary | ICD-10-CM | POA: Diagnosis not present

## 2022-04-06 NOTE — ED Triage Notes (Addendum)
Patient lost his balance and fell at home 2 days ago , hit his head against carpeted floor , denies LOC , alert and oriented , no headache, patient denies injury , he stated " just want to be checked".

## 2022-04-06 NOTE — ED Notes (Signed)
Patient not in waiting room, radiology or triage at this time.

## 2023-03-03 IMAGING — DX DG WRIST 2V*L*
2 series · 2 of 2 positions shown · non-contrast
Comparison: 08/18/2021

CLINICAL DATA: Reduction

EXAM:
LEFT WRIST - 2 VIEW

[wrist ap]
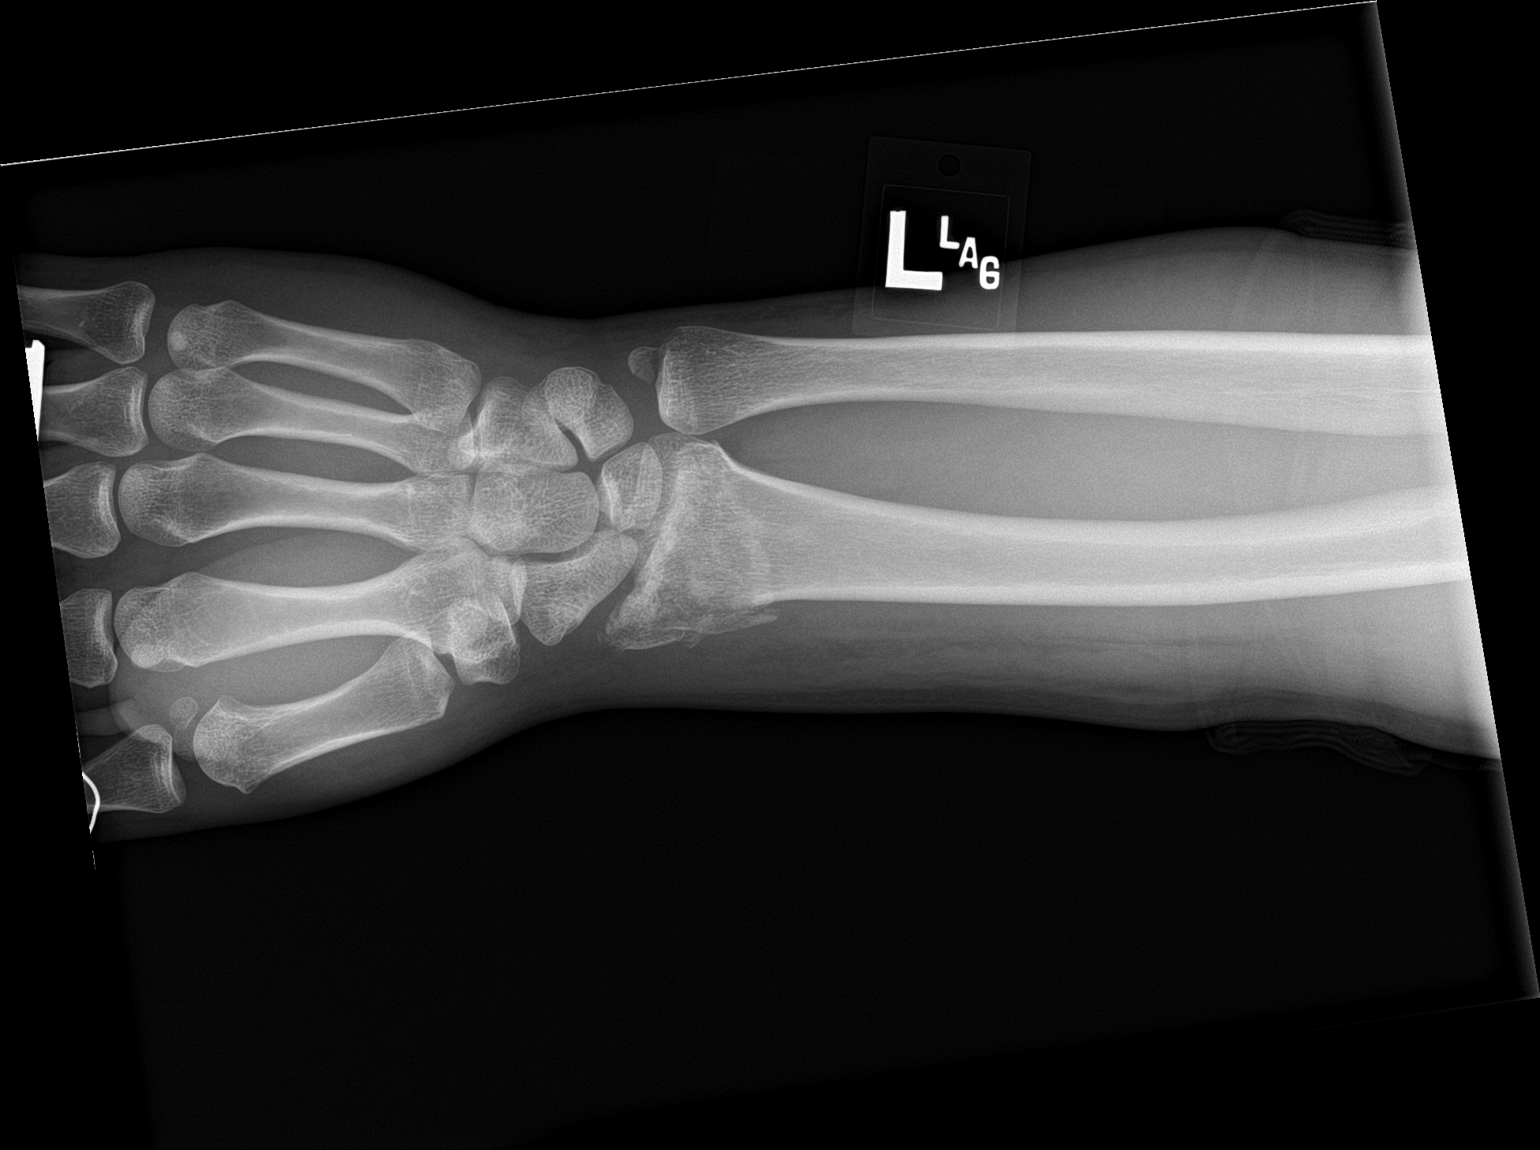

[wrist lat]
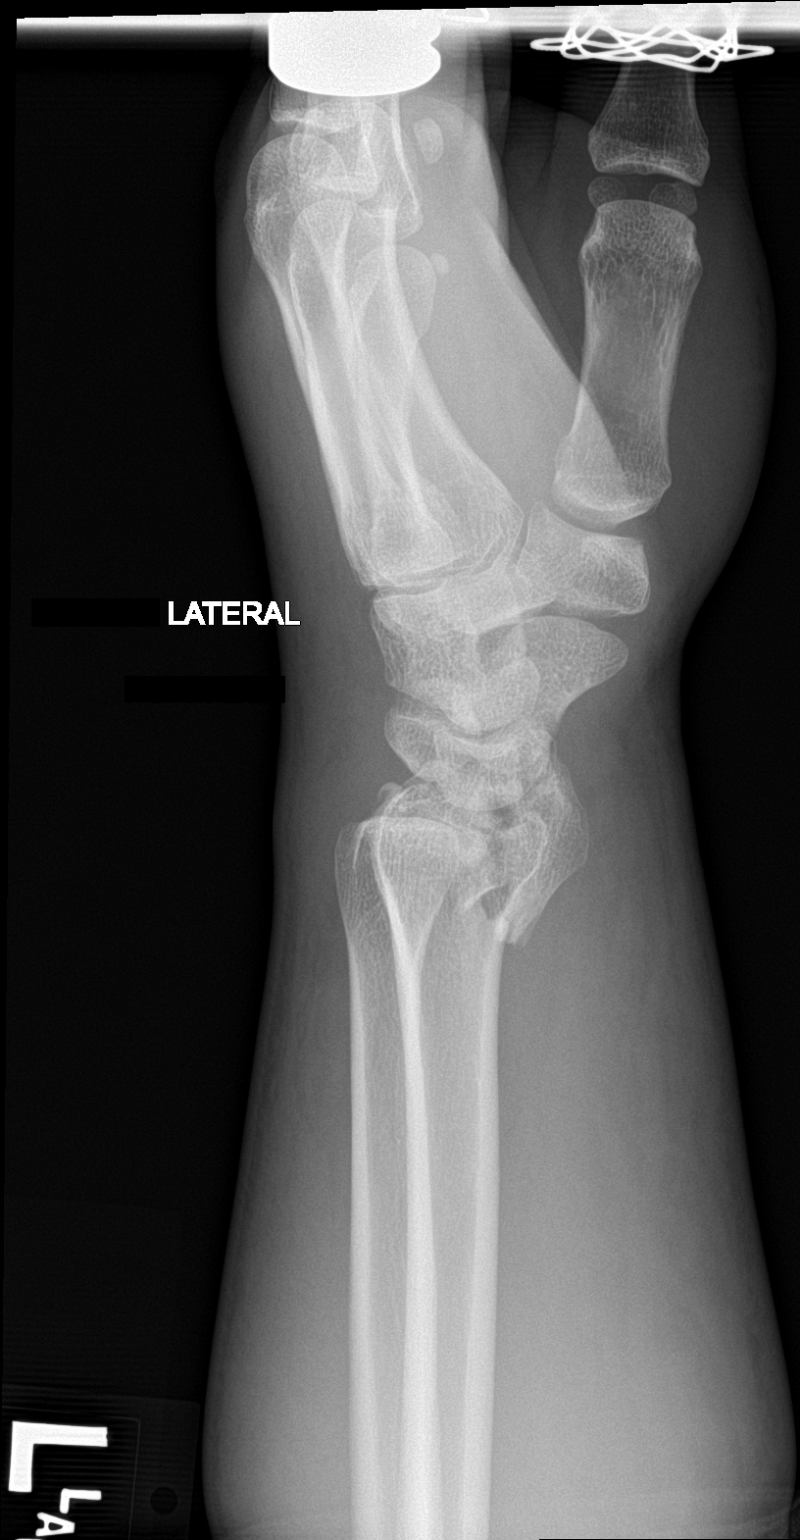

[2 of 2 positions shown; findings below may reference images not displayed]

FINDINGS: Highly comminuted intra-articular distal radius fracture with
decreased volar displacement. Acute ulnar styloid process fracture
similar in alignment.
IMPRESSION: Acute comminuted intra-articular distal radius fracture with
decreased displacement. Similar alignment of ulnar styloid process
fracture

## 2023-03-03 IMAGING — CR DG HAND COMPLETE 3+V*L*
3 series · 3 of 3 positions shown · non-contrast
Comparison: Wrist evaluation of the same date.

CLINICAL DATA: Fall with hand pain after skateboarding accident.

EXAM:
LEFT HAND - COMPLETE 3+ VIEW

[x hand pa left]
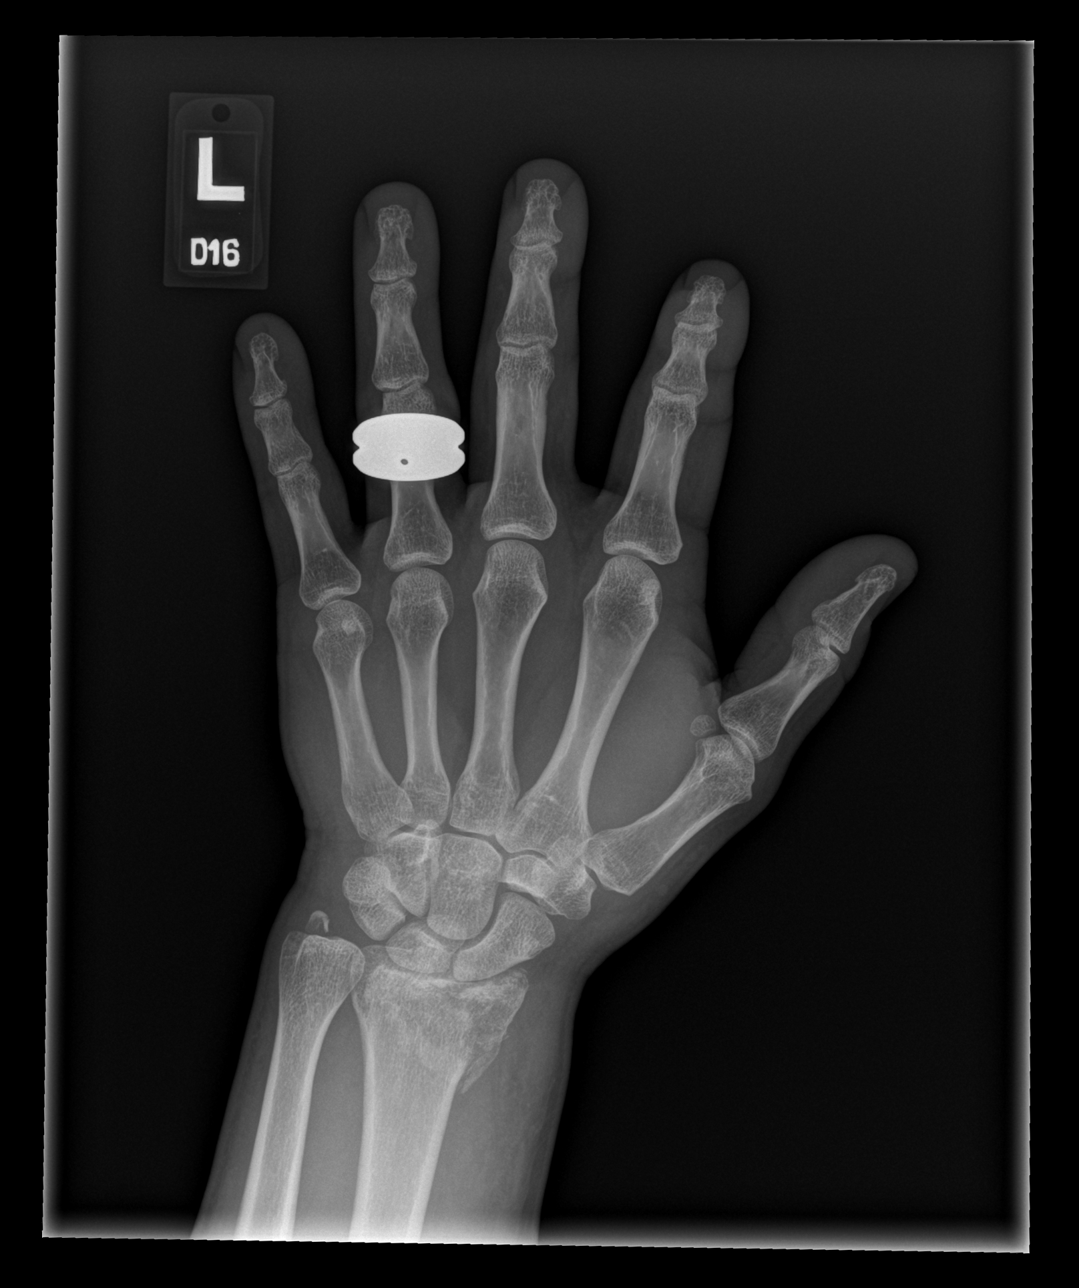

[x hand obl left]
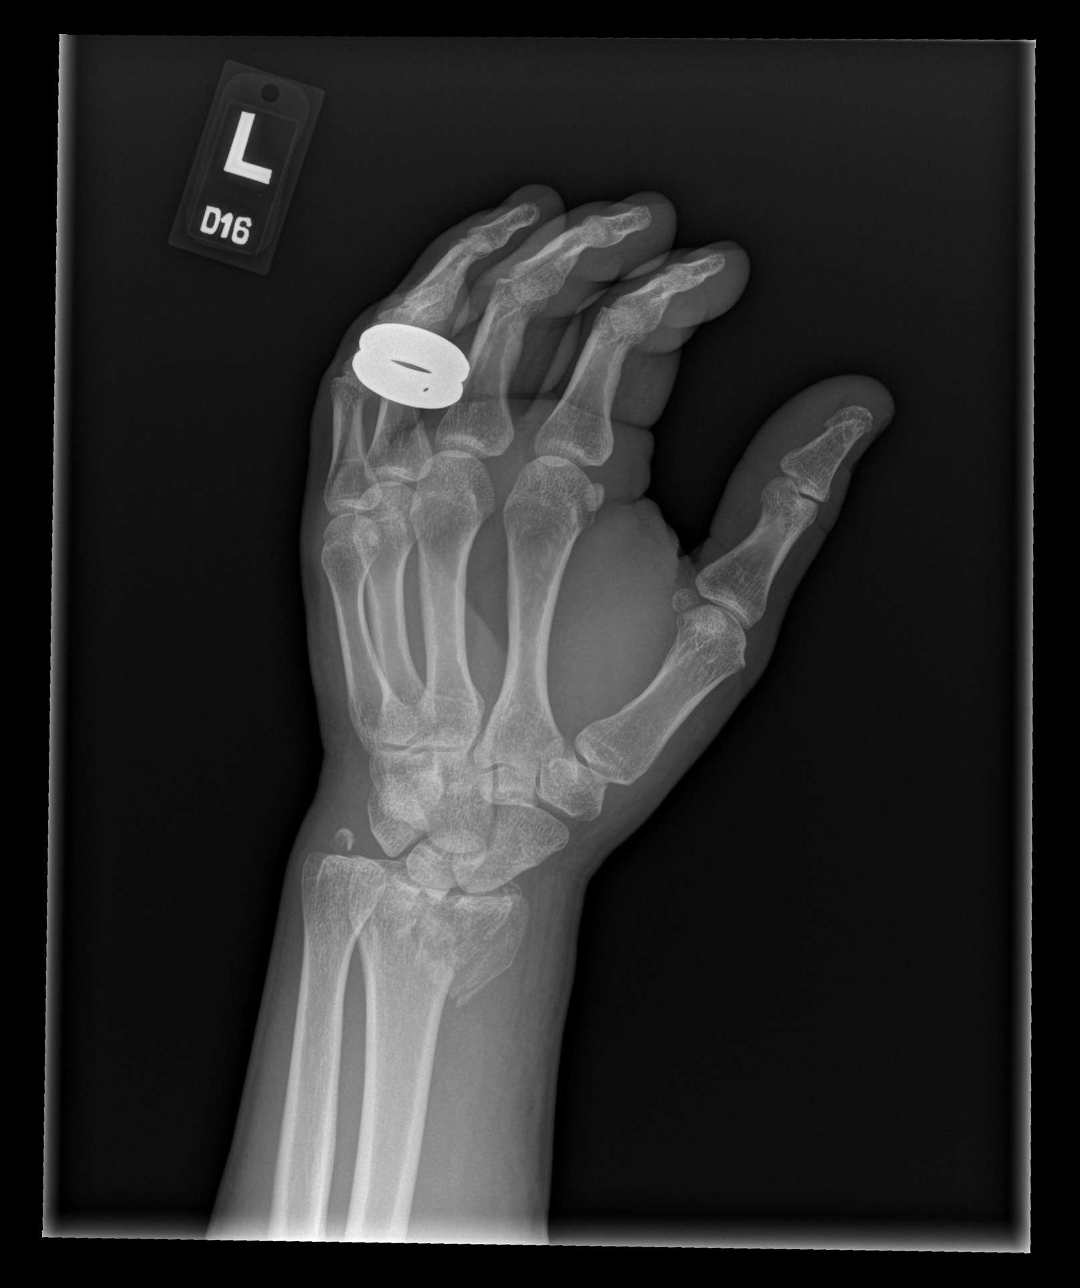

[x hand lat left]
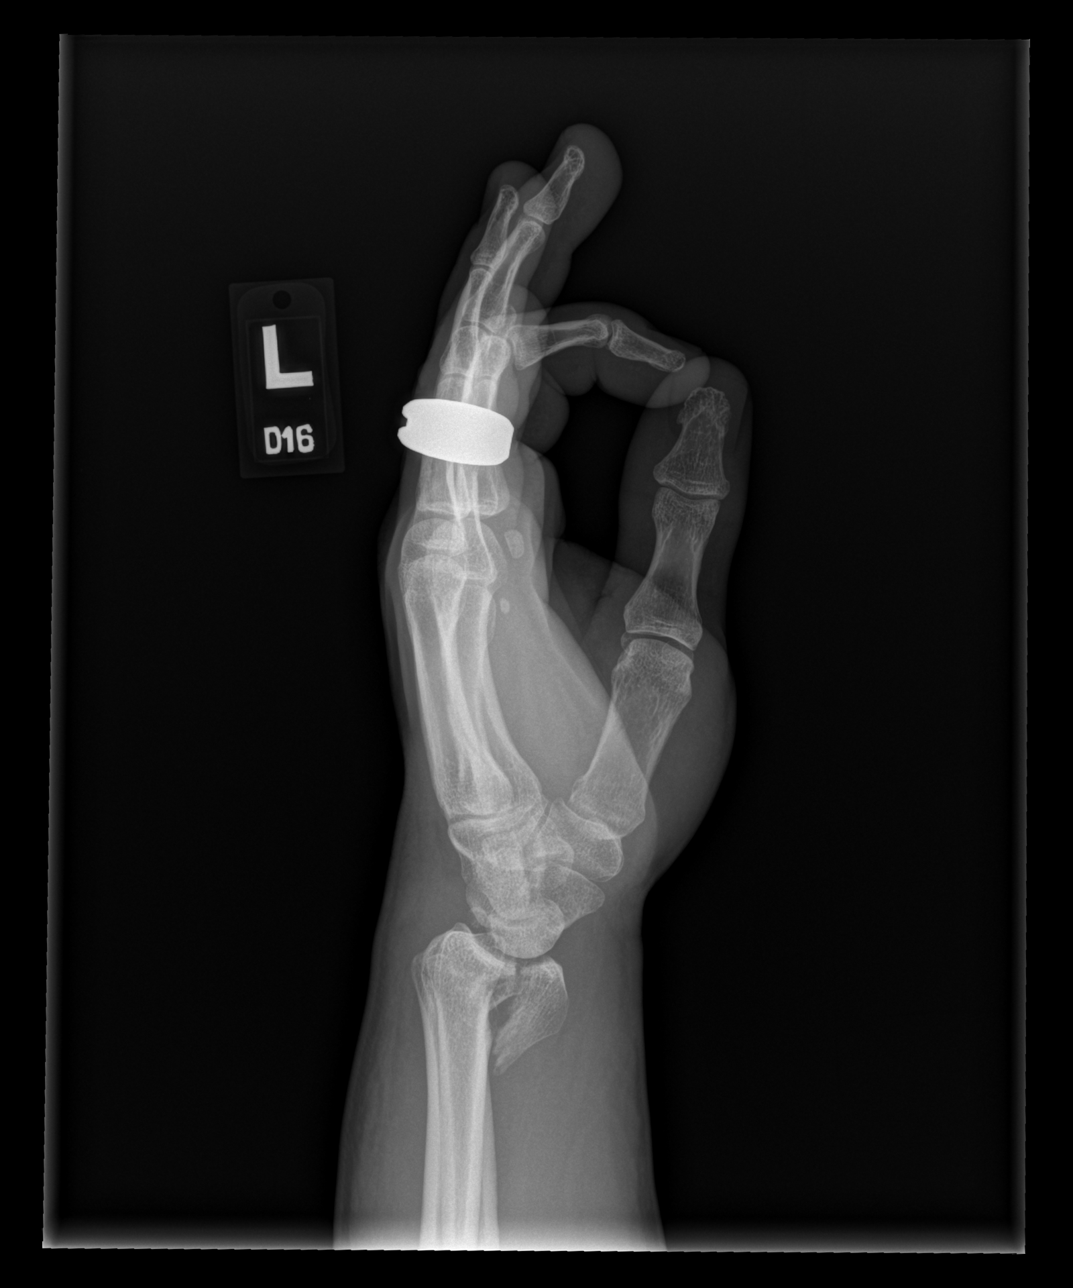

[3 of 3 positions shown; findings below may reference images not displayed]

FINDINGS: Impacted comminuted, angulated and displaced fracture of the distal
radius with associated ulnar fracture better displayed on wrist
evaluation of the same date.

Carpal bones are intact. There is volar displacement of the distal
radial fracture fragment that extends into the articular surface and
volar tilt of the distal radial articular surface.

Associated extensive soft tissue swelling mainly about the volar
aspect of the distal forearm.
IMPRESSION: Impacted comminuted distal radial fracture with intra-articular
extension and volar tilt of the distal articular surface.

Associated ulnar styloid fracture, see wrist radiographs.

## 2023-03-03 IMAGING — CT CT WRIST*L* W/O CM
2 series · 14 of 20 positions shown, 17 images · non-contrast
Comparison: Radiograph dated 08/18/2021.

CLINICAL DATA: Left wrist fracture.

EXAM:
CT OF THE LEFT WRIST WITHOUT CONTRAST
TECHNIQUE: Multidetector CT imaging was performed according to the standard
protocol. Multiplanar CT image reconstructions were also generated.
RADIATION DOSE REDUCTION: This exam was performed according to the
departmental dose-optimization program which includes automated
exposure control, adjustment of the mA and/or kV according to
patient size and/or use of iterative reconstruction technique.

[Series 3: axial st · axial · 0.55mm/px · z∈[-148,-38]mm · 11 of 67 slices shown, 14 images]
[im 6/67  soft-tissue]
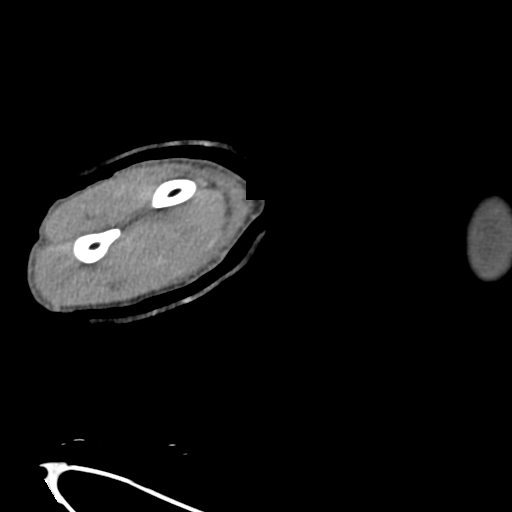
[im 6/67  bone]
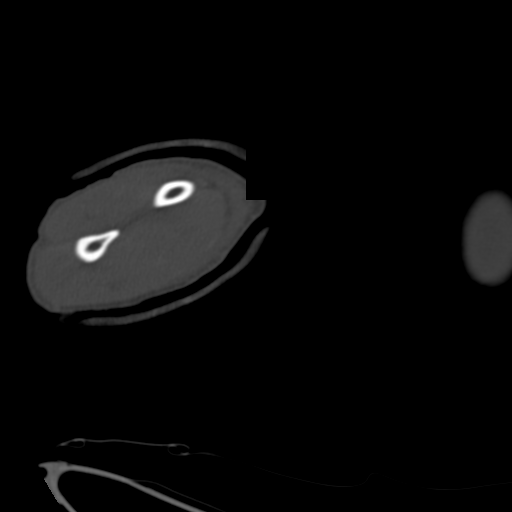
[im 11/67  bone]
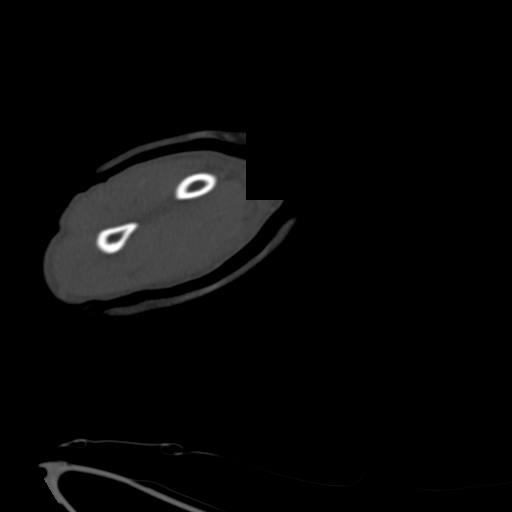
[im 16/67  bone]
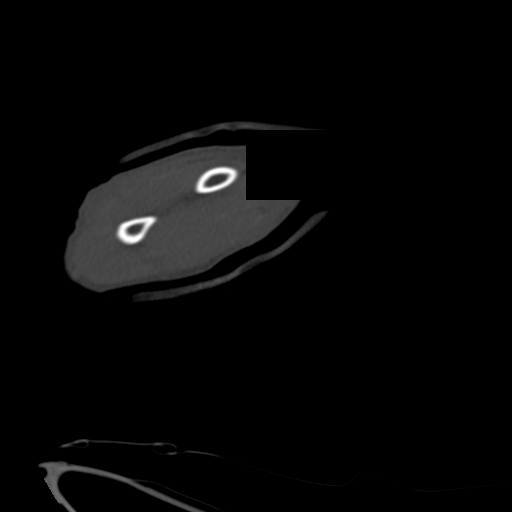
[im 21/67  bone]
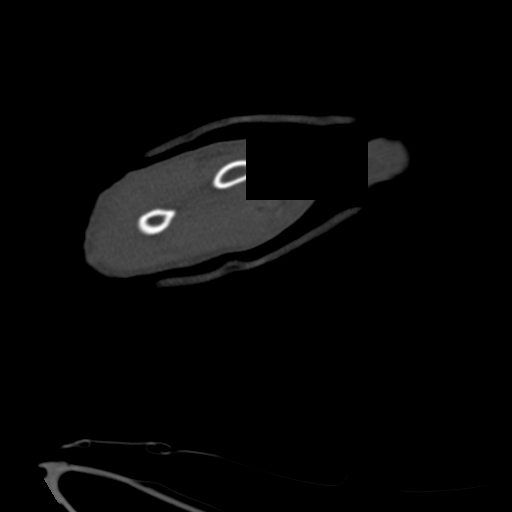
[im 26/67  soft-tissue]
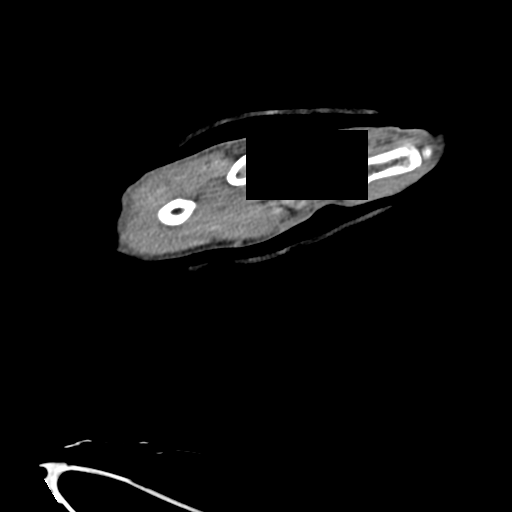
[im 26/67  bone]
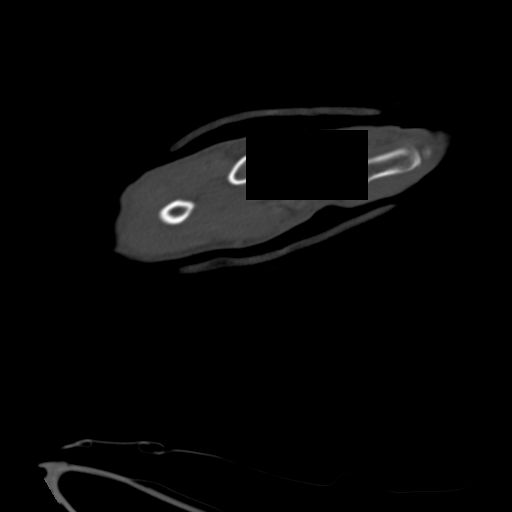
[im 36/67  bone]
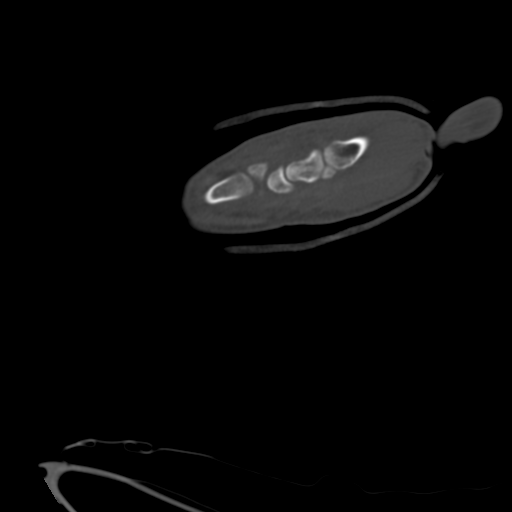
[im 41/67  bone]
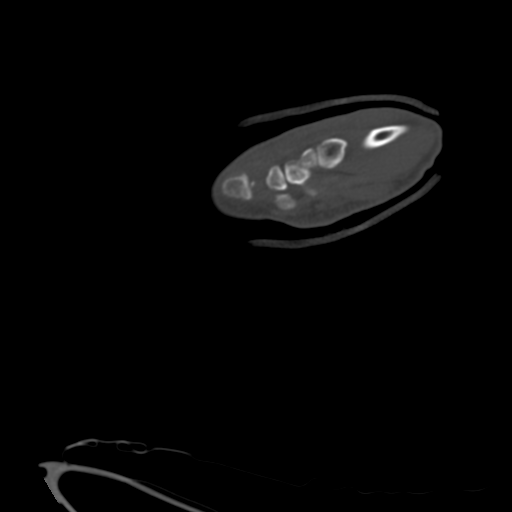
[im 46/67  bone]
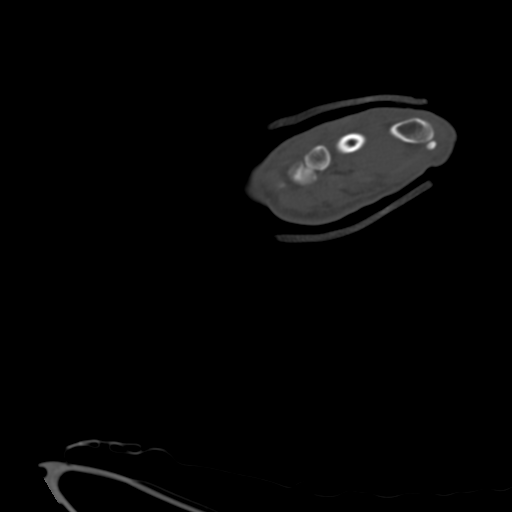
[im 51/67  soft-tissue]
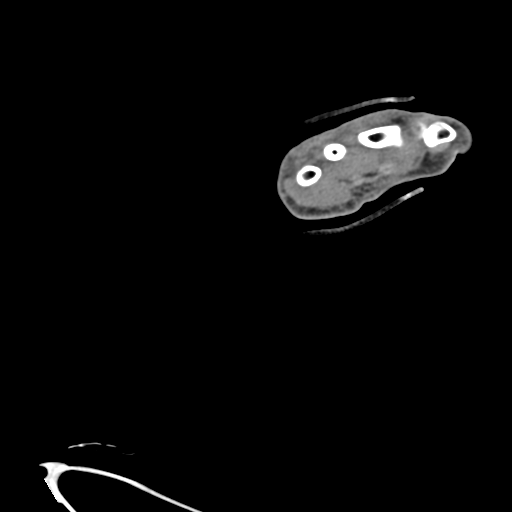
[im 51/67  bone]
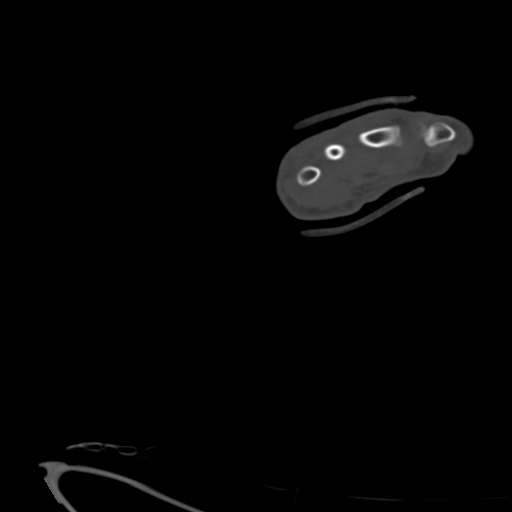
[im 56/67  bone]
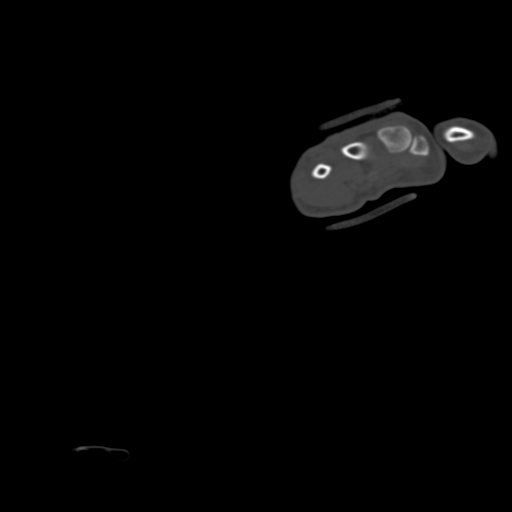
[im 61/67  bone]
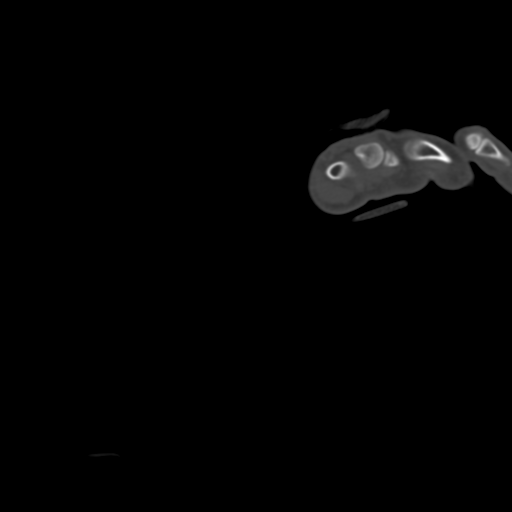

[Series 8: cor st · coronal · 0.32mm/px · 3 of 45 slices shown]
[im 18/45  bone]
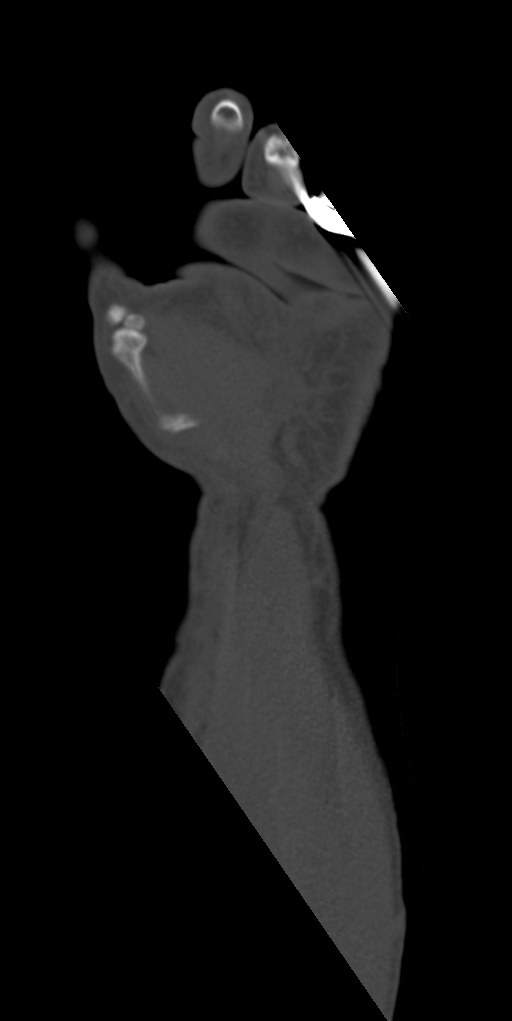
[im 27/45  bone]
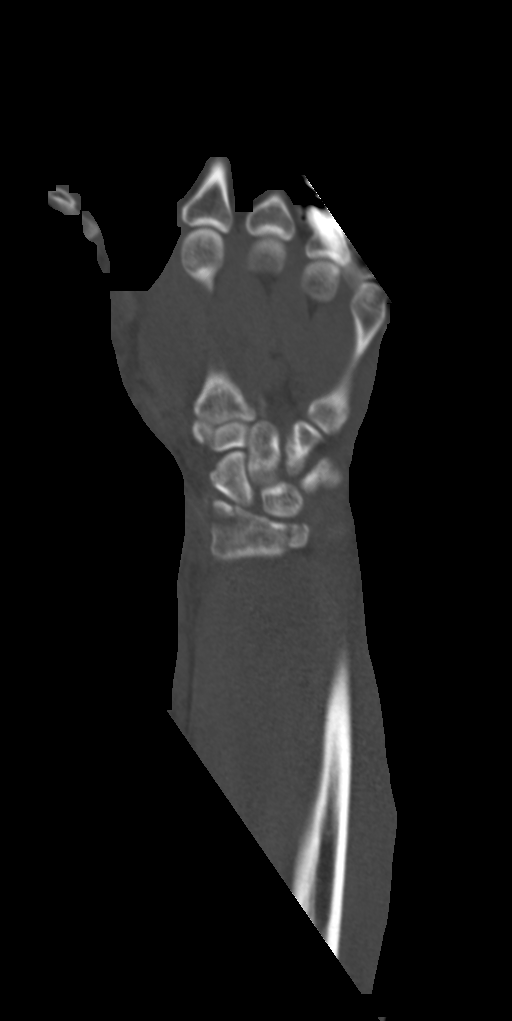
[im 36/45  bone]
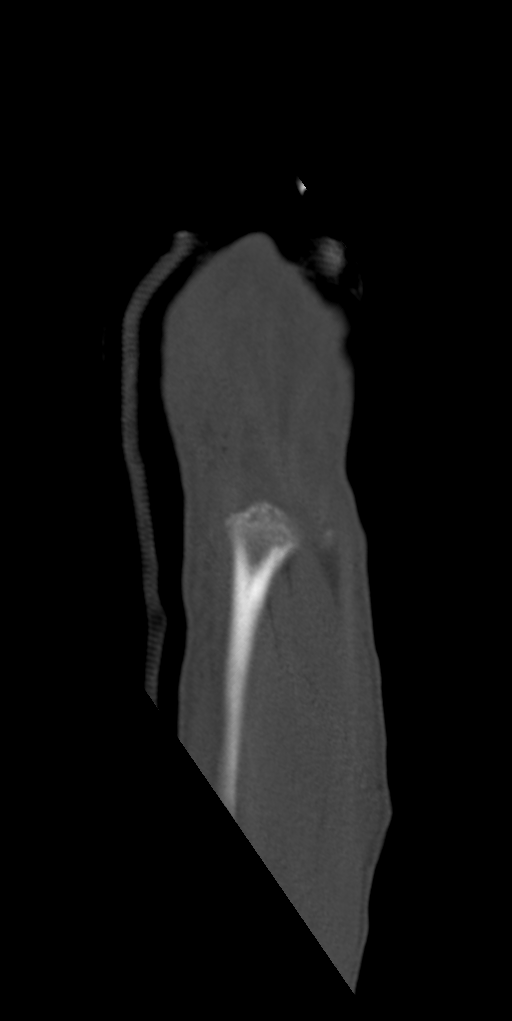

[14 of 20 positions shown; findings below may reference images not displayed]

FINDINGS: Bones/Joint/Cartilage

There is a comminuted, intra-articular fracture of the distal
radius. There is volar displacement with approximately 5 mm
distraction gap of a major fracture fragment. Minimally displaced
fracture of the ulnar styloid. No dislocation.

Ligaments

Suboptimally assessed by CT.

Muscles and Tendons

No acute findings.

Soft tissues

Soft tissue swelling of the wrist. No radiopaque foreign object or
soft tissue gas.
IMPRESSION: 1. Comminuted, intra-articular fracture of the distal radius.
2. Minimally displaced fracture of the ulnar styloid.

## 2023-03-03 IMAGING — CR DG WRIST COMPLETE 3+V*L*
4 series · 4 of 4 positions shown · non-contrast
Comparison: Hand evaluation of the same date.

CLINICAL DATA: A 35-year-old male presents following skateboard
accident.

EXAM:
LEFT WRIST - COMPLETE 3+ VIEW

[x wrist obl left]
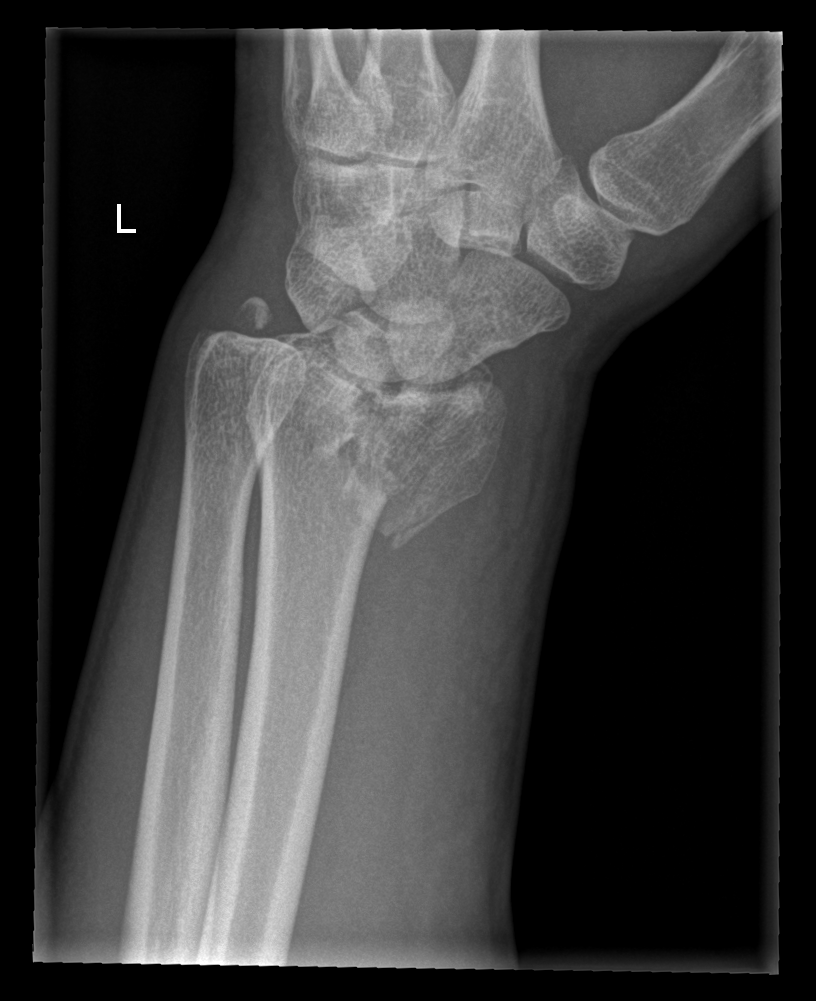

[x wrist lat left]
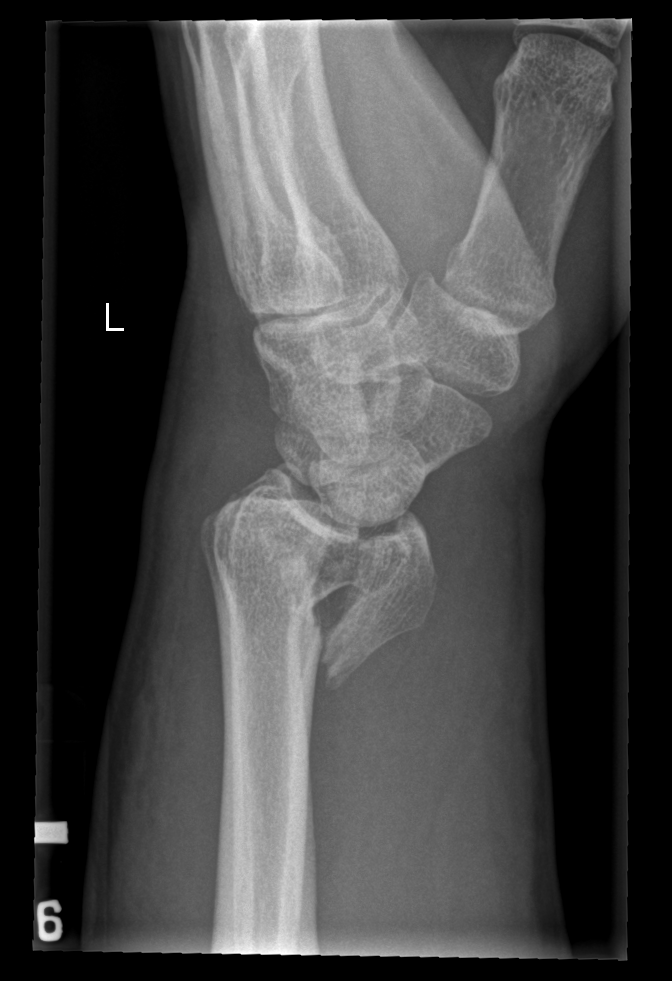

[x wrist pa left]
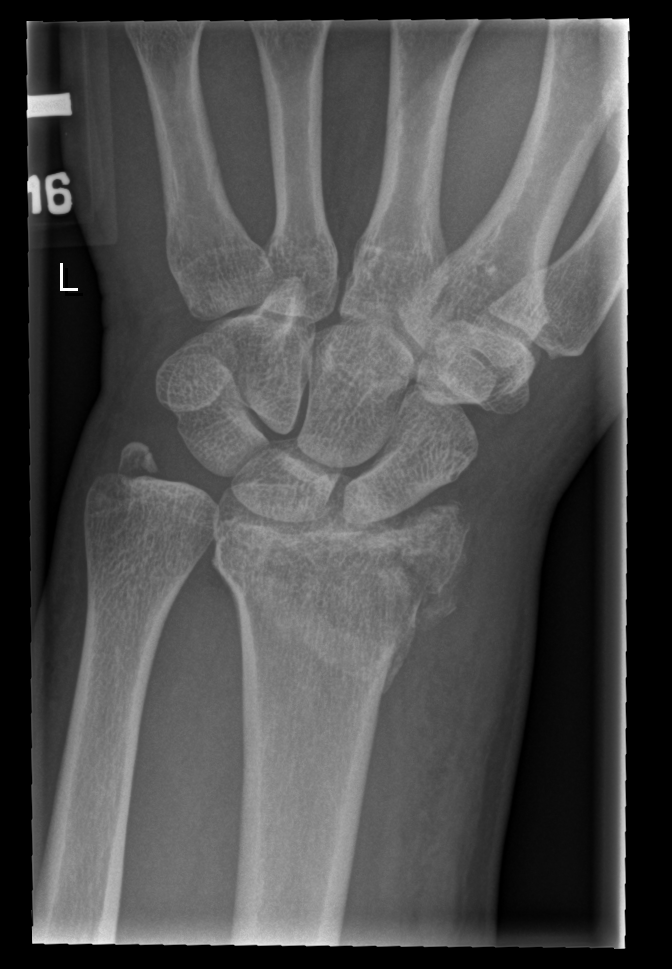

[x wrist navicular view left]
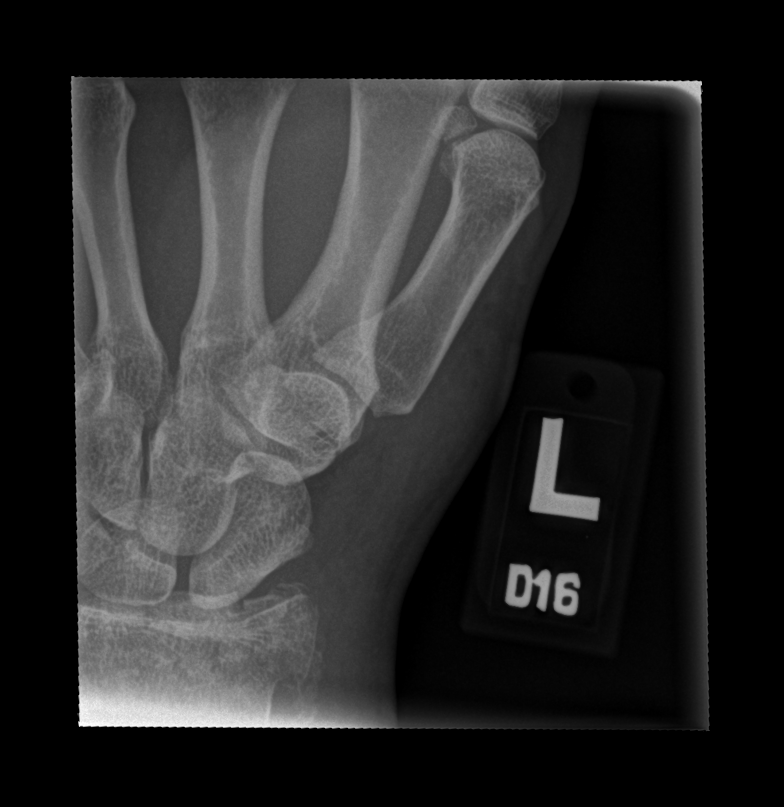

[4 of 4 positions shown; findings below may reference images not displayed]

FINDINGS: Intra-articular fracture with comminution, angulation and
displacement of the distal radial articular surface and ventral and
radial aspect of the distal radius. Displaced fracture fragment
approximately 4-5 mm of distraction. Volar tilt of the radial
articular surface is moderate. Associated ulnar styloid fracture and
extensive soft tissue swelling about the ventral distal forearm and
wrist.

No additional fractures are identified.
IMPRESSION: 1. Comminuted, displaced, intra-articular fracture of the distal
radius with moderate volar tilt of the radial articular surface.
2. Associated ulnar styloid fracture and extensive soft tissue
swelling.
